# Patient Record
Sex: Female | Born: 1975 | Race: White | Hispanic: No | Marital: Married | Smoking: Former smoker
Health system: Southern US, Community
[De-identification: ages and names within clinical notes are randomized; demographics above are authoritative.]

## PROBLEM LIST (undated history)

## (undated) DIAGNOSIS — M1990 Unspecified osteoarthritis, unspecified site: Secondary | ICD-10-CM

## (undated) DIAGNOSIS — F419 Anxiety disorder, unspecified: Secondary | ICD-10-CM

## (undated) DIAGNOSIS — S22000S Wedge compression fracture of unspecified thoracic vertebra, sequela: Secondary | ICD-10-CM

## (undated) HISTORY — PX: UPPER GI ENDOSCOPY: SHX6162

## (undated) HISTORY — DX: Unspecified osteoarthritis, unspecified site: M19.90

## (undated) HISTORY — DX: Wedge compression fracture of unspecified thoracic vertebra, sequela: S22.000S

## (undated) HISTORY — PX: UPPER GASTROINTESTINAL ENDOSCOPY: SHX188

## (undated) HISTORY — DX: Anxiety disorder, unspecified: F41.9

---

## 2006-01-01 HISTORY — PX: UPPER GASTROINTESTINAL ENDOSCOPY: SHX188

## 2013-03-31 ENCOUNTER — Ambulatory Visit: Payer: Self-pay | Admitting: Family Medicine

## 2013-11-25 ENCOUNTER — Encounter: Payer: Self-pay | Admitting: Physical Medicine and Rehabilitation

## 2013-12-03 ENCOUNTER — Ambulatory Visit: Payer: Self-pay | Admitting: Family Medicine

## 2014-11-12 ENCOUNTER — Ambulatory Visit (INDEPENDENT_AMBULATORY_CARE_PROVIDER_SITE_OTHER): Payer: BC Managed Care – PPO

## 2014-11-12 DIAGNOSIS — Z23 Encounter for immunization: Secondary | ICD-10-CM

## 2014-11-24 ENCOUNTER — Telehealth: Payer: Self-pay | Admitting: Family Medicine

## 2014-11-24 NOTE — Telephone Encounter (Signed)
Returned call to discuss patient's symptoms and left a voice message

## 2014-11-24 NOTE — Telephone Encounter (Signed)
Patient has developed a bacteria infection and stated that dr Manuella Ghazi has prescribed her something for this in the past. She has appointment in December and wanted to know if he can call it in to [facility]

## 2014-11-24 NOTE — Telephone Encounter (Signed)
Routed to Dr. Manuella Ghazi for prescription approval

## 2014-12-20 ENCOUNTER — Ambulatory Visit (INDEPENDENT_AMBULATORY_CARE_PROVIDER_SITE_OTHER): Payer: BC Managed Care – PPO | Admitting: Family Medicine

## 2014-12-20 ENCOUNTER — Encounter: Payer: BC Managed Care – PPO | Admitting: Family Medicine

## 2014-12-20 ENCOUNTER — Encounter: Payer: Self-pay | Admitting: Family Medicine

## 2014-12-20 VITALS — BP 122/78 | HR 72 | Temp 98.6°F | Resp 16 | Ht 65.0 in | Wt 130.9 lb

## 2014-12-20 DIAGNOSIS — A499 Bacterial infection, unspecified: Secondary | ICD-10-CM | POA: Diagnosis not present

## 2014-12-20 DIAGNOSIS — N760 Acute vaginitis: Secondary | ICD-10-CM | POA: Diagnosis not present

## 2014-12-20 DIAGNOSIS — B9689 Other specified bacterial agents as the cause of diseases classified elsewhere: Secondary | ICD-10-CM

## 2014-12-20 LAB — POCT WET PREP (WET MOUNT)

## 2014-12-20 MED ORDER — CLINDAMYCIN PHOSPHATE 2 % VA CREA: 1.0000 | TOPICAL_CREAM | Freq: Every day | VAGINAL | Status: DC

## 2014-12-20 NOTE — Patient Instructions (Signed)
Bacterial Vaginosis Bacterial vaginosis is a vaginal infection that occurs when the normal balance of bacteria in the vagina is disrupted. It results from an overgrowth of certain bacteria. This is the most common vaginal infection in women of childbearing age. Treatment is important to prevent complications, especially in pregnant women, as it can cause a premature delivery. CAUSES  Bacterial vaginosis is caused by an increase in harmful bacteria that are normally present in smaller amounts in the vagina. Several different kinds of bacteria can cause bacterial vaginosis. However, the reason that the condition develops is not fully understood. RISK FACTORS Certain activities or behaviors can put you at an increased risk of developing bacterial vaginosis, including:  Having a new sex partner or multiple sex partners.  Douching.  Using an intrauterine device (IUD) for contraception. Women do not get bacterial vaginosis from toilet seats, bedding, swimming pools, or contact with objects around them. SIGNS AND SYMPTOMS  Some women with bacterial vaginosis have no signs or symptoms. Common symptoms include:  Grey vaginal discharge.  A fishlike odor with discharge, especially after sexual intercourse.  Itching or burning of the vagina and vulva.  Burning or pain with urination. DIAGNOSIS  Your health care provider will take a medical history and examine the vagina for signs of bacterial vaginosis. A sample of vaginal fluid may be taken. Your health care provider will look at this sample under a microscope to check for bacteria and abnormal cells. A vaginal pH test may also be done.  TREATMENT  Bacterial vaginosis may be treated with antibiotic medicines. These may be given in the form of a pill or a vaginal cream. A second round of antibiotics may be prescribed if the condition comes back after treatment. Because bacterial vaginosis increases your risk for sexually transmitted diseases, getting  treated can help reduce your risk for chlamydia, gonorrhea, HIV, and herpes. HOME CARE INSTRUCTIONS   Only take over-the-counter or prescription medicines as directed by your health care provider.  If antibiotic medicine was prescribed, take it as directed. Make sure you finish it even if you start to feel better.  Tell all sexual partners that you have a vaginal infection. They should see their health care provider and be treated if they have problems, such as a mild rash or itching.  During treatment, it is important that you follow these instructions:  Avoid sexual activity or use condoms correctly.  Do not douche.  Avoid alcohol as directed by your health care provider.  Avoid breastfeeding as directed by your health care provider. SEEK MEDICAL CARE IF:   Your symptoms are not improving after 3 days of treatment.  You have increased discharge or pain.  You have a fever. MAKE SURE YOU:   Understand these instructions.  Will watch your condition.  Will get help right away if you are not doing well or get worse. FOR MORE INFORMATION  Centers for Disease Control and Prevention, Division of STD Prevention: AppraiserFraud.fi American Sexual Health Association (ASHA): www.ashastd.org    This information is not intended to replace advice given to you by your health care provider. Make sure you discuss any questions you have with your health care provider.   Document Released: 12/18/2004 Document Revised: 01/08/2014 Document Reviewed: 07/30/2012 Elsevier Interactive Patient Education [employer].

## 2014-12-20 NOTE — Progress Notes (Signed)
Name: Kaitlyn Harvey   MRN: [id number]    DOB: [birth year]   Date:12/20/2014       Progress Note  Subjective  Chief Complaint  Chief Complaint  Patient presents with  . Vaginal Discharge    Onset-couple of weeks, off-white discharge, vaginal itching and discharge. Has tried Monostat with no relief.     HPI  Vaginal discharge: she has a history of BV's about once a year. Over the past two weeks she noted some vaginal discomfort. Initially some itching, she used otc Monistat with some improvement, but over the past week has noticed some vaginal odor, foul smelling. Discharge is off-white and intermittent. No pain with intercourse. No dysuria. No cramping. LMP 12/07/016  There are no active problems to display for this patient.   Past Surgical History  Procedure Laterality Date  . Upper gi endoscopy      Family History  Problem Relation Age of Onset  . Asthma Mother   . Hypertension Mother   . Thyroid disease Mother   . Hypertension Brother   . Bell's palsy Brother     Social History   Social History  . Marital Status: Single    Spouse Name: N/A  . Number of Children: N/A  . Years of Education: N/A   Occupational History  . Not on file.   Social History Main Topics  . Smoking status: Current Every Day Smoker -- 0.25 packs/day for 20 years    Types: Cigarettes    Start date: 12/20/1994  . Smokeless tobacco: Never Used  . Alcohol Use: 0.0 oz/week    0 Standard drinks or equivalent per week     Comment: occasionally  . Drug Use: No  . Sexual Activity:    Partners: Male   Other Topics Concern  . Not on file   Social History Narrative  . No narrative on file     Current outpatient prescriptions:  .  Biotin 10 MG CAPS, Take 1 capsule by mouth daily., Disp: , Rfl:  .  Multiple Vitamin (MULTIVITAMIN) tablet, Take 1 tablet by mouth daily., Disp: , Rfl:   No Known Allergies   ROS  Ten systems reviewed and is negative except as mentioned in HPI    Objective  Filed Vitals:   12/20/14 1416  BP: 122/78  Pulse: 72  Temp: 98.6 F (37 C)  TempSrc: Oral  Resp: 16  Height: 5' 5"  (1.651 m)  Weight: 130 lb 14.4 oz (59.376 kg)  SpO2: 99%    Body mass index is 21.78 kg/(m^2).  Physical Exam  Constitutional: Patient appears well-developed and well-nourished.  No distress.  HEENT: head atraumatic, normocephalic, pupils equal and reactive to light,  neck supple, throat within normal limits Cardiovascular: Normal rate, regular rhythm and normal heart sounds.  No murmur heard. No BLE edema. Pulmonary/Chest: Effort normal and breath sounds normal. No respiratory distress. GU: moderate vaginal discharge, normal bimanual exam Abdominal: Soft.  There is no tenderness. Psychiatric: Patient has a normal mood and affect. behavior is normal. Judgment and thought content normal.  PHQ2/9: Depression screen PHQ 2/9 12/20/2014  Decreased Interest 0  Down, Depressed, Hopeless 0  PHQ - 2 Score 0    Fall Risk: Fall Risk  12/20/2014  Falls in the past year? No    Functional Status Survey: Is the patient deaf or have difficulty hearing?: No Does the patient have difficulty seeing, even when wearing glasses/contacts?: Yes (glasses) Does the patient have difficulty concentrating, remembering, or making decisions?: No Does  the patient have difficulty walking or climbing stairs?: No Does the patient have difficulty dressing or bathing?: No Does the patient have difficulty doing errands alone such as visiting a doctor's office or shopping?: No    Assessment & Plan  1. Bacterial vaginosis  - POCT Wet Prep (Wet [place]) - clindamycin (CLEOCIN) 2 % vaginal cream; Place 1 Applicatorful vaginally at bedtime.  Dispense: 40 g; Refill: 1

## 2015-01-11 ENCOUNTER — Encounter: Payer: BC Managed Care – PPO | Admitting: Family Medicine

## 2015-02-03 ENCOUNTER — Encounter: Payer: BC Managed Care – PPO | Admitting: Family Medicine

## 2015-02-10 ENCOUNTER — Encounter: Payer: Self-pay | Admitting: Physical Therapy

## 2015-02-10 ENCOUNTER — Ambulatory Visit: Payer: BC Managed Care – PPO | Attending: Specialist | Admitting: Physical Therapy

## 2015-02-10 DIAGNOSIS — M545 Low back pain: Secondary | ICD-10-CM | POA: Insufficient documentation

## 2015-02-10 DIAGNOSIS — G8929 Other chronic pain: Secondary | ICD-10-CM | POA: Diagnosis present

## 2015-02-10 DIAGNOSIS — M6281 Muscle weakness (generalized): Secondary | ICD-10-CM | POA: Diagnosis present

## 2015-02-10 DIAGNOSIS — R2991 Unspecified symptoms and signs involving the musculoskeletal system: Secondary | ICD-10-CM | POA: Diagnosis present

## 2015-02-10 DIAGNOSIS — R29818 Other symptoms and signs involving the nervous system: Secondary | ICD-10-CM

## 2015-02-10 NOTE — Therapy (Signed)
[place] [place] [facility] [address], [place], [address] Phone: [phone]   Fax:  [phone]  Physical Therapy Evaluation  Patient Details  Name: Kaitlyn Harvey MRN: [id number] Date of Birth: [birth year] Referring Provider: Earnestine Leys MD  Encounter Date: 02/10/2015      PT End of Session - 02/10/15 1749    Visit Number 1   Number of Visits 12   Date for PT Re-Evaluation 03/24/15   PT Start Time 0430   PT Stop Time 0532   PT Time Calculation (min) 62 min   Activity Tolerance Patient tolerated treatment well;No increased pain   Behavior During Therapy [facility] for tasks assessed/performed      History reviewed. No pertinent past medical history.  Past Surgical History  Procedure Laterality Date  . Upper gi endoscopy      There were no vitals filed for this visit.  Visit Diagnosis:  Chronic midline low back pain without sciatica - Plan: PT plan of care cert/re-cert  Poor motor control of trunk - Plan: PT plan of care cert/re-cert  Muscle weakness - Plan: PT plan of care cert/re-cert      Subjective Assessment - 02/10/15 1655    Subjective Pain located in the center of the lumbar spine which spreads laterally when aggravated. Increased pain during bending, sitting, and lifting. Pain increased in the morning and at the end of the day . Sleeps on her side on a regular mattress .    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   Limitations Sitting;Lifting;Other (comment)  Bending   How long can you sit comfortably? 25 minutes   Patient Stated Goals learning proper exercises, decreasing pain to allow to perform work related activites.    Currently in Pain? Yes   Pain Score 5    Pain Location Back   Pain Descriptors / Indicators Aching;Sharp   Pain Type Chronic pain   Pain Radiating Towards No radiating  pain   Pain Onset More than a month ago   Aggravating Factors  Increased pain while performing bending, lifting, and during increased sitting time.   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  No   Is the patient reluctant to leave their home because of a fear of falling?  No   Home Environment   Living Environment Private residence   Living Arrangements Spouse/significant other;Children   Type of [place] to enter   Entrance Stairs-Number of Steps 2   Entrance Stairs-Rails None   Home Layout Two level   Alternate Level Stairs-Number of Steps 13   Alternate Level Stairs-Rails Right  Home Equipment None   Prior Function   Level of Independence Independent   Vocation Full time employment   Retail banker: Squatting, lifting, bending   Leisure Exercise(weight lifting), ride a bike, elicpital, treadmill,    Cognition   Overall Cognitive Status Within Functional Limits for tasks assessed     Objective:  Patient performed all therapeutic techniques under therapist guidance with cueing to correct and use proper form and allow for appropiate joint alignment. Observation: Decreased base of support when standing with minor hip internal rotation and adduction noted  Measurements: Lumbar AROM: Flexion: Hands to toes -- increased pain  noted at end range and returning to neutral position, extension: 50% of normal limits -- increased pain, left side bending: WNL, right side bending: WNL -- pain at end range, right rotation: WNL, Left rotation: WNL Hip AROM/MMT (all measurements are the same bilaterally) : Flexion: WNL; 5/5, Extension: WNL; 5/5, abduction: WNL; 5/5, adduction: WNL ;5/5, Hip external rotation: WNL; 5/5  Neuro Exam: Dermatomes: L1-5, S1-2: WNL Myotomes: L1-5, S1-2: 5/5 strong and painless Reflexes: patellar tendon, achilles: WNL  Passive assecories: Hypomobility: L1-5, S1-2 -- pain reported with grade 4 mobilization to L4-5  Special Tests: -SLR: (-) -Hamstring length: (-)  Motor Control Testing: -Multifidus test: Decreased on right side, hyperactivity on left side -Transverse Abdominis test: Weak activation required frequent cueing  Therapeutic Exercise: -Clamshells& Tra contractions -- Added to [facility] Therapy: -Passive assessorcies: L4-5: 45sec   Patient response to treatment: No aggravation of symptoms during or post treatment. Pt with decreased activation of left multifidus during prone hip extension indicating poor motor control. Decreased TrA activation during draw in maneuver and will benefit from further skilled therapy aimed at increasing motor control of post and anterior to better return to prior level of function.         PT Education - 02/10/15 1747    Education provided Yes   Education Details HEP: abdominal draw in, clamshells. Given handout of exercises and verbally explained pain theory and application to patients case.    Person(s) Educated Patient   Methods Explanation;Demonstration;Tactile cues;Handout   Comprehension Verbalized understanding             PT Long Term Goals - 02/10/15 1940    PT LONG TERM GOAL #1   Title Patient will score less than 10% on the Modified Oswestry to show functional improvement in lower back function by 03/24/15.   Baseline 32%  Impaired   Status New   PT LONG TERM GOAL #2   Title Patient will score less than 20/96 on the FABQ to demonstrate significant improvement in fear avoidance to allow for the completion of functional activities by 03/24/15.    Baseline 49/96   Status New   PT LONG TERM GOAL #3   Title Patient will be independent when performing HEP to show functional carryover after completion of therapy program by 03/24/15.   Baseline Patient with limited knowledge on proper exercise progression and pain control.    Status New               Plan - 02/10/15 1751    Clinical Impression Statement Patient is a 40 yo female who presents with central low back pain which inhibits  functional occupational activities: llifting, bending, and sitting for longer periods of tim. She demonstrates decreased motor control and core stabilization. She will benefit from skilled therapy aimed at improving motor control and core stabilization limitations in order to  return to prior level of function.     Pt will benefit from skilled therapeutic intervention in order to improve on the following deficits Decreased mobility;Decreased range of motion;Increased fascial restricitons;Hypomobility;Decreased strength;Increased muscle spasms;Pain   Rehab Potential Good   Clinical Impairments Affecting Rehab Potential (+) Motivation; (-) current smoker, chronic pain   PT Frequency 2x / week   PT Duration 6 weeks   PT Treatment/Interventions ADLs/Self Care Home Management;Cryotherapy;Electrical Stimulation;Iontophoresis 55m/ml Dexamethasone;Moist Heat;Patient/family education;Ultrasound;Neuromuscular re-education;Therapeutic exercise;Therapeutic activities;Manual techniques;Passive range of motion   PT Next Visit Plan Manual soft tissue mobilizations and passive assecories to lower lumbar spine. Address motor control defiects in lumbar spine.   PT Home Exercise Plan Abdominal draw in manuever, clamshells   Consulted and Agree with Plan  of Care Patient         Problem List There are no active problems to display for this patient.   WBlythe Stanford SPT 02/11/2015, 12:51 PM  [facility] [address] [place] [address]Phone: [phone]  Fax:  [phone] Name: ALakin RhineMRN: [id number] of Birth: [birth year]

## 2015-02-15 ENCOUNTER — Ambulatory Visit: Payer: BC Managed Care – PPO | Admitting: Physical Therapy

## 2015-02-15 ENCOUNTER — Encounter: Payer: BC Managed Care – PPO | Admitting: Physical Therapy

## 2015-02-15 DIAGNOSIS — G8929 Other chronic pain: Secondary | ICD-10-CM

## 2015-02-15 DIAGNOSIS — M545 Low back pain: Secondary | ICD-10-CM | POA: Diagnosis not present

## 2015-02-15 DIAGNOSIS — R2991 Unspecified symptoms and signs involving the musculoskeletal system: Secondary | ICD-10-CM

## 2015-02-15 DIAGNOSIS — M6281 Muscle weakness (generalized): Secondary | ICD-10-CM

## 2015-02-15 DIAGNOSIS — R29818 Other symptoms and signs involving the nervous system: Secondary | ICD-10-CM

## 2015-02-15 NOTE — Therapy (Signed)
[place] [facility] [address], [place], [address] Phone: [phone]   Fax:  [phone]  Physical Therapy Treatment  Patient Details  Name: Kaitlyn Harvey MRN: [id number] Date of Birth: 1975-08-10 Referring Provider: Earnestine Leys MD  Encounter Date: 02/15/2015      PT End of Session - 02/15/15 1728    Visit Number 2   Number of Visits 12   Date for PT Re-Evaluation 03/24/15   PT Start Time 4268   PT Stop Time 3419   PT Time Calculation (min) 44 min   Activity Tolerance Patient tolerated treatment well;No increased pain   Behavior During Therapy [facility] for tasks assessed/performed      No past medical history on file.  Past Surgical History  Procedure Laterality Date  . Upper gi endoscopy      There were no vitals filed for this visit.  Visit Diagnosis:  Chronic midline low back pain without sciatica  Poor motor control of trunk  Muscle weakness      Subjective Assessment - 02/15/15 1637    Subjective Pt reports increased throbbing pain yesterday after work. States menstral cycle is approaching and pain is increased.    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   Limitations Sitting;Lifting;Other (comment)   How long can you sit comfortably? 25 minutes   Patient Stated Goals learning proper exercises, decreasing pain to allow to perform work related activites.    Currently in Pain? No/denies      Objective:      [facility] Adult PT Treatment/Exercise - 02/15/15 1744    Exercises   Patient performed exercises with guidance, verbal and tactile cues and demonstration of therapist: Prone press ups -- 2 x 10 Standing Lumbar Ext -- x 10 TrA activation in hooklying with marching bilaterally -- x 10 (alternating legs) Clamshells bilaterally -- x 15 Prone hip ext bilaterally -- 2 x 10  (alternating legs; performed throughout partial range on right secondary to decreased motor control)    Manual Therapy   STM and superficial techniques to lumbar extensors in prone to decrease muscle spasms and increase elasticity of the tissue.        Patient response to treatment:  Decreased muscle spasms after performing STM indicating increased elasticity in the muscle fibers. Patient demonstrated difficulty when performing prone hip extension exercise requiring frequent tactile/verbal cueing and moving throughout partial ROM to correct. Patient demonstrated decreased pain along lower lumbar segments after performing prone press up indicating improved joint position and motor control.          PT Education - 02/15/15 1725    Education provided Yes   Education Details HEP: abdominal draw in, clamshells, prone hip ext, prone press up. Explained importance of motor control when performing exercises.    Person(s) Educated Patient   Methods Explanation;Demonstration;Tactile cues   Comprehension Verbalized understanding             PT Long Term Goals - 02/10/15 1940    PT LONG TERM GOAL #1   Title Patient will score less than 10% on the Modified Oswestry to show functional improvement in lower back function by 03/24/15.   Baseline 32% Impaired   Status New   PT LONG TERM GOAL #2   Title Patient will score less than 20/96 on the FABQ to demonstrate significant improvement in fear avoidance to  allow for the completion of functional activities by 03/24/15.    Baseline 49/96   Status New   PT LONG TERM GOAL #3   Title Patient will be independent when performing HEP to show functional carryover after completion of therapy program by 03/24/15.   Baseline Patient with limited knowledge on proper exercise progression and pain control.    Status New               Plan - 02/15/15 1733    Clinical Impression Statement Patient responded well to treatment and continues to advance  towards long term goals. Patient continued to exhibit decreased motor control with posterior/anterior core activation during the treatment session but had no exaccerbation in symptoms. Patient will benefit from further skilled therapy addressing motor conrol and endurance to better perform functional activities such as sitting for long periods of time and lifting at work.    Pt will benefit from skilled therapeutic intervention in order to improve on the following deficits Decreased mobility;Decreased range of motion;Increased fascial restricitons;Hypomobility;Decreased strength;Increased muscle spasms;Pain   Rehab Potential Good   Clinical Impairments Affecting Rehab Potential (+) Motivation; (-) current smoker, chronic pain   PT Frequency 2x / week   PT Duration 6 weeks   PT Treatment/Interventions ADLs/Self Care Home Management;Cryotherapy;Electrical Stimulation;Iontophoresis 51m/ml Dexamethasone;Moist Heat;Patient/family education;Ultrasound;Neuromuscular re-education;Therapeutic exercise;Therapeutic activities;Manual techniques;Passive range of motion   PT Next Visit Plan Add addition motor control exercises and continue STM with prone press up. Multifidus [place], [place].   PT Home Exercise Plan Abdominal draw in manuever, clamshells, prone hip ext.   Consulted and Agree with Plan of Care Patient        Problem List There are no active problems to display for this patient.   WBlythe Stanford SPT 02/15/2015, 6:02 PM  [facility] [address] [place] [address]Phone: [phone]  Fax:  [phone] Name: Kaitlyn SegersMRN: [id number] of Birth: 402-Nov-1977

## 2015-02-16 ENCOUNTER — Encounter: Payer: Self-pay | Admitting: Physical Therapy

## 2015-02-17 ENCOUNTER — Encounter: Payer: Self-pay | Admitting: Physical Therapy

## 2015-02-17 ENCOUNTER — Ambulatory Visit: Payer: BC Managed Care – PPO | Admitting: Physical Therapy

## 2015-02-17 DIAGNOSIS — M545 Low back pain, unspecified: Secondary | ICD-10-CM

## 2015-02-17 DIAGNOSIS — R2991 Unspecified symptoms and signs involving the musculoskeletal system: Secondary | ICD-10-CM

## 2015-02-17 DIAGNOSIS — M6281 Muscle weakness (generalized): Secondary | ICD-10-CM

## 2015-02-17 DIAGNOSIS — R29818 Other symptoms and signs involving the nervous system: Secondary | ICD-10-CM

## 2015-02-17 DIAGNOSIS — G8929 Other chronic pain: Secondary | ICD-10-CM

## 2015-02-17 NOTE — Therapy (Signed)
[facility] [address] [place], [address] Phone: [phone]   Fax:  [phone]  Physical Therapy Treatment  Patient Details  Name: Kaitlyn Harvey MRN: [id number] Date of Birth: [birth year] Referring Provider: Earnestine Leys MD  Encounter Date: 02/17/2015      PT End of Session - 02/17/15 1726    Visit Number 3   Number of Visits 12   Date for PT Re-Evaluation 03/24/15   PT Start Time 4081   PT Stop Time 4481   PT Time Calculation (min) 44 min   Activity Tolerance Patient tolerated treatment well;No increased pain   Behavior During Therapy [facility] for tasks assessed/performed      History reviewed. No pertinent past medical history.  Past Surgical History  Procedure Laterality Date  . Upper gi endoscopy      There were no vitals filed for this visit.  Visit Diagnosis:  Chronic midline low back pain without sciatica  Poor motor control of trunk  Muscle weakness      Subjective Assessment - 02/17/15 1639    Subjective Pt reports temporary increase in pain when bending over to attend to child at work but states she has no pain currently.   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   Limitations Sitting;Lifting;Other (comment)   How long can you sit comfortably? 25 minutes   Patient Stated Goals learning proper exercises, decreasing pain to allow to perform work related activites.    Currently in Pain? No/denies      Objective:  AROM: lumbar flexion: WNL's, extension limited by pain at end range pre treatment    [facility] Adult PT Treatment/Exercise - 02/17/15 1744    Exercises   Patient performed exercises with guidance, verbal and tactile cues and demonstration of therapist:   Prone press ups x 10  TrA activation in hooklying with marching bilaterally -- 2 x 5  Prone hip ext  bilaterally --x 15 (alternating legs; performed throughout partial range on right secondary to decreased motor control) Multifidus Crunch in sitting --  2 x 15 Lateral side stepping -- 10f with green band around thighs    Manual Therapy   STM and myofascial release utilizing superficial and deep techniques to lumbar extensors in prone to decrease muscle spasms and increase elasticity of ttissue.   Patient response to treatment:  Decreased muscle spasms by 50% lumbar extensors following manual therapy and prone press ups. Patient exhibited improvement in multifidus activation after performing prone hip extension indicating improvement in lumbar motor control and required frequent verbal cuing to perform with proper positioning and technique, Lumbar extension improved to WNL's following exercises in prone (press ups)          PT Education - 02/17/15 1722    Education provided Yes   Education Details HEP: Lateral side stepping with green band   Person(s) Educated Patient   Methods Explanation;Demonstration;Tactile cues   Comprehension Verbalized understanding             PT Long Term Goals - 02/10/15 1940    PT LONG TERM GOAL #1   Title Patient will score less than 10% on the Modified Oswestry to show functional improvement in lower back function by 03/24/15.   Baseline 32% Impaired   Status New   PT LONG TERM GOAL #2   Title Patient will score less than 20/96  on the FABQ to demonstrate significant improvement in fear avoidance to allow for the completion of functional activities by 03/24/15.    Baseline 49/96   Status New   PT LONG TERM GOAL #3   Title Patient will be independent when performing HEP to show functional carryover after completion of therapy program by 03/24/15.   Baseline Patient with limited knowledge on proper exercise progression and pain control.    Status New               Plan - 02/17/15 1728    Clinical Impression Statement Patient responded  well to treatment and demonstrated improved motor control with lumbar/pelvic motions. Patient is progressing towards long term goals with improvement of motor control in unweighted positions. She remains limited with motor control in standing positions and performing functional tasks. Pt will benefit from further skilled therapy aimed at increasing muscle coordination and endurance to the pelvic/lumbar musculature to better perform occupational duties.    Pt will benefit from skilled therapeutic intervention in order to improve on the following deficits Decreased mobility;Decreased range of motion;Increased fascial restricitons;Hypomobility;Decreased strength;Increased muscle spasms;Pain   Rehab Potential Good   Clinical Impairments Affecting Rehab Potential (+) Motivation; (-) current smoker, chronic pain   PT Frequency 2x / week   PT Duration 6 weeks   PT Treatment/Interventions ADLs/Self Care Home Management;Cryotherapy;Electrical Stimulation;Iontophoresis 32m/ml Dexamethasone;Moist Heat;Patient/family education;Ultrasound;Neuromuscular re-education;Therapeutic exercise;Therapeutic activities;Manual techniques;Passive range of motion   PT Next Visit Plan Add addition motor control exercises and continue STM with prone press up. .   PT Home Exercise Plan Abdominal draw in manuever, clamshells, prone hip ext.   Consulted and Agree with Plan of Care Patient        Problem List There are no active problems to display for this patient.   WBlythe Stanford SPT 02/17/2015, 5:42 PM  [facility] [address] [place] [address]Phone: [phone]  Fax:  [phone] Name: Kaitlyn RamdassMRN: [id number] of Birth: [birth year]

## 2015-02-23 ENCOUNTER — Ambulatory Visit: Payer: BC Managed Care – PPO | Admitting: Physical Therapy

## 2015-02-23 ENCOUNTER — Encounter: Payer: Self-pay | Admitting: Physical Therapy

## 2015-02-23 DIAGNOSIS — M545 Low back pain, unspecified: Secondary | ICD-10-CM

## 2015-02-23 DIAGNOSIS — G8929 Other chronic pain: Secondary | ICD-10-CM

## 2015-02-23 DIAGNOSIS — R2991 Unspecified symptoms and signs involving the musculoskeletal system: Secondary | ICD-10-CM

## 2015-02-23 DIAGNOSIS — M6281 Muscle weakness (generalized): Secondary | ICD-10-CM

## 2015-02-23 DIAGNOSIS — R29818 Other symptoms and signs involving the nervous system: Secondary | ICD-10-CM

## 2015-02-23 NOTE — Therapy (Signed)
[facility] [address], [place], [address] Phone: [phone]   Fax:  [phone]  Physical Therapy Treatment  Patient Details  Name: Kaitlyn Harvey MRN: [id number] Date of Birth: 09-13-1975 Referring Provider: Earnestine Leys MD  Encounter Date: 02/23/2015      PT End of Session - 02/23/15 1700    Visit Number 4   Number of Visits 12   Date for PT Re-Evaluation 03/24/15   PT Start Time 9163   PT Stop Time 1625   PT Time Calculation (min) 42 min   Activity Tolerance Patient tolerated treatment well   Behavior During Therapy [facility] for tasks assessed/performed      History reviewed. No pertinent past medical history.  Past Surgical History  Procedure Laterality Date  . Upper gi endoscopy      There were no vitals filed for this visit.  Visit Diagnosis:  Chronic midline low back pain without sciatica  Poor motor control of trunk  Muscle weakness      Subjective Assessment - 02/23/15 1544    Subjective Pt reports no pain currently and mentions having pain 4 hours ago when sitting. States having increased low back pain associated with her menstrual cycle yesterday.   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   Limitations Sitting;Lifting;Other (comment)   How long can you sit comfortably? 25 minutes   Patient Stated Goals learning proper exercises, decreasing pain to allow to perform work related activites.    Currently in Pain? No/denies       Objective:  AROM: lumbar flexion: WNL's, lumbar extension: limited by 25 with pain reported at end range.    [place] Adult PT Treatment/Exercise - 02/23/15    Exercises   Patient performed exercises with guidance, verbal and tactile cues and demonstration of therapist:  Standing lumbar extension --  2 x 10 Prone hip ext bilaterally --2  x 10 (alternating legs; performed throughout partial range on right secondary to decreased motor control) Multifidus Crunch in sitting -- x 15, x 10 with yellow band Lateral side stepping with backwards stepping -- x 7 each side with grey band  Lumbar extension with grey resistive band -- 2 x 10    Patient response to treatment:  Improved multifidus and gluteal activation with exercises with minimal verbal and tactile cuing, demonstrated good technique and alignment of trunk and extremities with repetition/verbal cues for all exercises       PT Long Term Goals - 02/10/15 1940    PT LONG TERM GOAL #1   Title Patient will score less than 10% on the Modified Oswestry to show functional improvement in lower back function by 03/24/15.   Baseline 32% Impaired   Status New   PT LONG TERM GOAL #2   Title Patient will score less than 20/96 on the FABQ to demonstrate significant improvement in fear avoidance to allow for the completion of functional activities by 03/24/15.    Baseline 49/96   Status New   PT LONG TERM GOAL #3   Title Patient will be independent when performing HEP to show functional carryover after completion of therapy program by 03/24/15.   Baseline Patient with limited knowledge on proper exercise progression and pain control.    Status New               Plan - 02/23/15 1702  Clinical Impression Statement Patient is progressing towards long term goals exhibiting increased motor control with anterior and posterior core activation.Required decreased tactile cueing today to perform TrA and demonstrated ability to perform TrA activation in standing indicating improved functional motor control.  Progressed exercises today tolerating increased weight bearing activities. She continues with limited knowledge of appropriate exercises and progression and will benefit from further skilled therapy aimed improving motor control and muscular endurance to return to prior level of  function.    Pt will benefit from skilled therapeutic intervention in order to improve on the following deficits Decreased mobility;Decreased range of motion;Increased fascial restricitons;Hypomobility;Decreased strength;Increased muscle spasms;Pain   Rehab Potential Good   Clinical Impairments Affecting Rehab Potential (+) Motivation; (-) current smoker, chronic pain   PT Frequency 2x / week   PT Duration 6 weeks   PT Treatment/Interventions ADLs/Self Care Home Management;Cryotherapy;Electrical Stimulation;Iontophoresis 28m/ml Dexamethasone;Moist Heat;Patient/family education;Ultrasound;Neuromuscular re-education;Therapeutic exercise;Therapeutic activities;Manual techniques;Passive range of motion   PT Next Visit Plan Further weight bearing core stabilzation activities.    PT Home Exercise Plan Abdominal draw in manuever, clamshells, prone hip ext, resisted side stepping, resisted lumbar ext   Consulted and Agree with Plan of Care Patient        Problem List There are no active problems to display for this patient.   WBlythe Stanford SPT 02/23/2015, 5:12 PM  [facility] [address] [place] [address]Phone: [phone]  Fax:  [phone] Name: Kaitlyn McgroryMRN: [id number] of Birth: 404/13/1977

## 2015-02-28 ENCOUNTER — Ambulatory Visit (INDEPENDENT_AMBULATORY_CARE_PROVIDER_SITE_OTHER): Payer: BC Managed Care – PPO | Admitting: Family Medicine

## 2015-02-28 ENCOUNTER — Encounter: Payer: Self-pay | Admitting: Family Medicine

## 2015-02-28 ENCOUNTER — Ambulatory Visit: Payer: BC Managed Care – PPO | Admitting: Physical Therapy

## 2015-02-28 ENCOUNTER — Other Ambulatory Visit: Payer: Self-pay | Admitting: Family Medicine

## 2015-02-28 VITALS — BP 120/79 | HR 77 | Temp 98.2°F | Resp 16 | Ht 65.0 in | Wt 124.7 lb

## 2015-02-28 DIAGNOSIS — Z0000 Encounter for general adult medical examination without abnormal findings: Secondary | ICD-10-CM | POA: Insufficient documentation

## 2015-02-28 DIAGNOSIS — Z1239 Encounter for other screening for malignant neoplasm of breast: Secondary | ICD-10-CM | POA: Insufficient documentation

## 2015-02-28 DIAGNOSIS — Z01419 Encounter for gynecological examination (general) (routine) without abnormal findings: Secondary | ICD-10-CM | POA: Diagnosis not present

## 2015-02-28 DIAGNOSIS — Z720 Tobacco use: Secondary | ICD-10-CM

## 2015-02-28 MED ORDER — NICOTINE 7 MG/24HR TD PT24: 7.0000 mg | MEDICATED_PATCH | Freq: Every day | TRANSDERMAL | Status: DC

## 2015-02-28 MED ORDER — NICOTINE 14 MG/24HR TD PT24: 14.0000 mg | MEDICATED_PATCH | Freq: Every day | TRANSDERMAL | Status: DC

## 2015-02-28 NOTE — Progress Notes (Signed)
Name: Janaiyah Blackard   MRN: [id number]    DOB: Jan 09, 1975   Date:02/28/2015       Progress Note  Subjective  Chief Complaint  Chief Complaint  Patient presents with  . Annual Exam    CPE w pap    Nicotine Dependence Presents for initial visit. Symptoms are negative for insomnia and sore throat. Preferred tobacco types include cigarettes. Her urge triggers include company of smokers. Past treatments include nicotine patch (Was on Nictoine 46m/hr patch, which was too strong.).    Pt. Is here for a Complete Physical Exam. She is doing well.   History reviewed. No pertinent past medical history.  Past Surgical History  Procedure Laterality Date  . Upper gi endoscopy      Family History  Problem Relation Age of Onset  . Asthma Mother   . Hypertension Mother   . Thyroid disease Mother   . Hypertension Brother   . Bell's palsy Brother     Social History   Social History  . Marital Status: Single    Spouse Name: N/A  . Number of Children: N/A  . Years of Education: N/A   Occupational History  . Not on file.   Social History Main Topics  . Smoking status: Current Every Day Smoker -- 0.25 packs/day for 20 years    Types: Cigarettes    Start date: 12/20/1994  . Smokeless tobacco: Never Used  . Alcohol Use: 0.0 oz/week    0 Standard drinks or equivalent per week     Comment: occasionally  . Drug Use: No  . Sexual Activity:    Partners: Male   Other Topics Concern  . Not on file   Social History Narrative    Current outpatient prescriptions:  .  Biotin 10 MG CAPS, Take 1 capsule by mouth daily., Disp: , Rfl:  .  meloxicam (MOBIC) 15 MG tablet, Take 15 mg by mouth daily., Disp: , Rfl:  .  methocarbamol (ROBAXIN) 500 MG tablet, Take 500 mg by mouth 2 (two) times daily., Disp: , Rfl: 3 .  Multiple Vitamin (MULTIVITAMIN) tablet, Take 1 tablet by mouth daily., Disp: , Rfl:   No Known Allergies   Review of Systems  Constitutional: Negative for fever, chills,  weight loss and malaise/fatigue.  HENT: Negative for sore throat.   Eyes: Negative for blurred vision and double vision.  Respiratory: Negative for cough and sputum production.   Cardiovascular: Negative for chest pain.  Gastrointestinal: Negative for heartburn, nausea, vomiting, abdominal pain, blood in stool and melena.  Genitourinary: Negative for dysuria and hematuria.  Musculoskeletal: Positive for back pain. Negative for myalgias.  Skin: Negative for rash.  Neurological: Negative for headaches.  Psychiatric/Behavioral: Negative for depression. The patient is not nervous/anxious and does not have insomnia.     Objective  Filed Vitals:   02/28/15 1427  BP: 120/79  Pulse: 77  Temp: 98.2 F (36.8 C)  TempSrc: Oral  Resp: 16  Height: 5' 5"  (1.651 m)  Weight: 124 lb 11.2 oz (56.564 kg)  SpO2: 98%    Physical Exam  Constitutional: She is oriented to person, place, and time and well-developed, well-nourished, and in no distress.  HENT:  Head: Normocephalic and atraumatic.  Eyes: Pupils are equal, round, and reactive to light. Right conjunctiva has a hemorrhage.    Cardiovascular: Normal rate and regular rhythm.   Pulmonary/Chest: Effort normal and breath sounds normal.  Abdominal: Soft. Bowel sounds are normal.  Musculoskeletal:  Right ankle: She exhibits no swelling.       Left ankle: She exhibits no swelling.  Neurological: She is alert and oriented to person, place, and time.  Psychiatric: Memory, affect and judgment normal.  Nursing note and vitals reviewed.    Assessment & Plan  1. Well woman exam with routine gynecological exam Age appropriate laboratory screenings including Pap smear. - Lipid Profile - CBC with Differential - Comprehensive Metabolic Panel (CMET) - TSH - Vitamin D (25 hydroxy) - Cytology - PAP  2. Screening for breast cancer  - MM Digital Screening; Future  3. Tobacco abuse Started on nicotine patch starting with 40 mg for 6  weeks, transitioning to 7 mg for 2 weeks. Quit smoking at onset of treatment. Recheck in 8 weeks - nicotine (NICODERM CQ - DOSED IN MG/24 HOURS) 14 mg/24hr patch; Place 1 patch (14 mg total) onto the skin daily.  Dispense: 42 patch; Refill: 0 - nicotine (NICODERM CQ - DOSED IN MG/24 HR) 7 mg/24hr patch; Place 1 patch (7 mg total) onto the skin daily.  Dispense: 14 patch; Refill: 0   Kaloni Bisaillon Asad A[facility] 02/28/2015 2:43 PM

## 2015-03-01 NOTE — Addendum Note (Signed)
Addended byManuella Ghazi, Annalysia Willenbring A A on: 03/01/2015 09:06 AM   Modules accepted: SmartSet

## 2015-03-02 ENCOUNTER — Encounter: Payer: Self-pay | Admitting: Physical Therapy

## 2015-03-02 ENCOUNTER — Ambulatory Visit: Payer: BC Managed Care – PPO | Attending: Specialist | Admitting: Physical Therapy

## 2015-03-02 DIAGNOSIS — M545 Low back pain, unspecified: Secondary | ICD-10-CM

## 2015-03-02 DIAGNOSIS — G8929 Other chronic pain: Secondary | ICD-10-CM | POA: Diagnosis present

## 2015-03-02 DIAGNOSIS — M6281 Muscle weakness (generalized): Secondary | ICD-10-CM | POA: Diagnosis present

## 2015-03-02 DIAGNOSIS — R2991 Unspecified symptoms and signs involving the musculoskeletal system: Secondary | ICD-10-CM | POA: Diagnosis not present

## 2015-03-02 DIAGNOSIS — R29818 Other symptoms and signs involving the nervous system: Secondary | ICD-10-CM

## 2015-03-02 NOTE — Therapy (Signed)
[facility] [address], [place], [address] Phone: [phone]   Fax:  [phone]  Physical Therapy Treatment  Patient Details  Name: Kaitlyn Harvey MRN: [id number] Date of Birth: [birth year] Referring Provider: Earnestine Leys MD  Encounter Date: 03/02/2015      PT End of Session - 03/02/15 1837    Visit Number 5   Number of Visits 12   Date for PT Re-Evaluation 03/24/15   PT Start Time 1620   PT Stop Time 1659   PT Time Calculation (min) 39 min   Activity Tolerance Patient tolerated treatment well   Behavior During Therapy [facility] for tasks assessed/performed      History reviewed. No pertinent past medical history.  Past Surgical History  Procedure Laterality Date  . Upper gi endoscopy      There were no vitals filed for this visit.  Visit Diagnosis:  Poor motor control of trunk  Chronic midline low back pain without sciatica  Muscle weakness      Subjective Assessment - 03/02/15 1626    Subjective Pt reports no current low back pain. States she was able to perform occupational tasks without onset of pain today.   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   Limitations Sitting;Lifting;Other (comment)   How long can you sit comfortably? 25 minutes   Patient Stated Goals learning proper exercises, decreasing pain to allow to perform work related activites.    Currently in Pain? No/denies   Pain Score 0-No pain       Objective:  AROM: lumbar flexion: WNL's, lumbar extension: WNL -- no pain reported at end range.   [place] Adult PT Treatment/Exercise - 03/02/15    Exercises   Patient performed exercises with guidance, verbal and tactile cues and demonstration of therapist:  Multifidus Crunch in sitting -- 2 x 10 with red band Dead bud alternating legs -- x 10 Birddog  alternating arms and legs-- x10 alternating LEs Hip machine: ABD -- 40 # ADD & EXT -- 70# x15 each LE Lumbar extension with grey resistive band -- 2 x 10    Patient response to treatment:  Patient required minimal tactile and verbal cueing to perform multifidus crunch with appropriate muscular activation. Increased pelvic rotation when performing birddog exercise which is corrected after cueing to activate anterior core musculature, improved technique with exercises for hip exercises in standing following demonstration and with verbal cuing      PT Education - 03/02/15 1837    Education provided Yes   Education Details HEP: birddog, deadbug exercise   Person(s) Educated Patient   Methods Explanation;Demonstration   Comprehension Verbalized understanding             PT Long Term Goals - 02/10/15 1940    PT LONG TERM GOAL #1   Title Patient will score less than 10% on the Modified Oswestry to show functional improvement in lower back function by 03/24/15.   Baseline 32% Impaired   Status New   PT LONG TERM GOAL #2   Title Patient will score less than 20/96 on the FABQ to demonstrate significant improvement in fear avoidance to allow for the completion of functional activities by 03/24/15.    Baseline 49/96   Status New   PT LONG TERM GOAL #3   Title Patient will be independent when performing HEP to show functional  carryover after completion of therapy program by 03/24/15.   Baseline Patient with limited knowledge on proper exercise progression and pain control.    Status New               Plan - 03/02/15 1840    Clinical Impression Statement Patient is progressing towards long term goals with improving core stabilization and exercise progression. Patient continues with limitations of decreased muscular endurance and motor control and will benefit from continued skilled therapy aimed at improving limitations to return to prior level of function.    Pt will benefit from  skilled therapeutic intervention in order to improve on the following deficits Decreased mobility;Decreased range of motion;Increased fascial restricitons;Hypomobility;Decreased strength;Increased muscle spasms;Pain   Rehab Potential Good   Clinical Impairments Affecting Rehab Potential (+) Motivation; (-) current smoker, chronic pain   PT Frequency 2x / week   PT Duration 6 weeks   PT Treatment/Interventions ADLs/Self Care Home Management;Cryotherapy;Electrical Stimulation;Iontophoresis 23m/ml Dexamethasone;Moist Heat;Patient/family education;Ultrasound;Neuromuscular re-education;Therapeutic exercise;Therapeutic activities;Manual techniques;Passive range of motion   PT Next Visit Plan Further weight bearing core stabilzation activities.    PT Home Exercise Plan Abdominal draw in manuever, clamshells, prone hip ext, resisted side stepping, resisted lumbar ext   Consulted and Agree with Plan of Care Patient        Problem List Patient Active Problem List   Diagnosis Date Noted  . Well woman exam with routine gynecological exam 02/28/2015  . Tobacco abuse 02/28/2015  . Screening for breast cancer 02/28/2015    WBlythe Stanford SPT 03/02/2015, 6:52 PM  [facility] [address] [place] [address]Phone: [phone]  Fax:  [phone] Name: Kaitlyn SarrisMRN: [id number] of Birth: 418-Sep-1977

## 2015-03-04 LAB — PAP LB, RFX HPV ASCU: PAP Smear Comment: 0

## 2015-03-07 ENCOUNTER — Ambulatory Visit: Payer: BC Managed Care – PPO | Admitting: Physical Therapy

## 2015-03-09 ENCOUNTER — Ambulatory Visit: Payer: BC Managed Care – PPO | Admitting: Physical Therapy

## 2015-03-09 LAB — CBC WITH DIFFERENTIAL/PLATELET
BASOS ABS: 0.1 10*3/uL (ref 0.0–0.2)
BASOS: 1 %
EOS (ABSOLUTE): 0.4 10*3/uL (ref 0.0–0.4)
EOS: 6 %
Hematocrit: 39.2 % (ref 34.0–46.6)
Hemoglobin: 13.3 g/dL (ref 11.1–15.9)
IMMATURE GRANS (ABS): 0 10*3/uL (ref 0.0–0.1)
Immature Granulocytes: 0 %
Lymphocytes Absolute: 3 10*3/uL (ref 0.7–3.1)
Lymphs: 52 %
MCH: 33.8 pg — ABNORMAL HIGH (ref 26.6–33.0)
MCHC: 33.9 g/dL (ref 31.5–35.7)
MCV: 100 fL — AB (ref 79–97)
Monocytes Absolute: 0.5 10*3/uL (ref 0.1–0.9)
Monocytes: 9 %
Neutrophils Absolute: 1.9 10*3/uL (ref 1.4–7.0)
Neutrophils: 32 %
PLATELETS: 306 10*3/uL (ref 150–379)
RBC: 3.94 x10E6/uL (ref 3.77–5.28)
RDW: 13.8 % (ref 12.3–15.4)
WBC: 5.9 10*3/uL (ref 3.4–10.8)

## 2015-03-09 LAB — COMPREHENSIVE METABOLIC PANEL
ALT: 15 IU/L (ref 0–32)
AST: 23 IU/L (ref 0–40)
Albumin/Globulin Ratio: 2 (ref 1.1–2.5)
Albumin: 4.7 g/dL (ref 3.5–5.5)
Alkaline Phosphatase: 44 IU/L (ref 39–117)
BUN/Creatinine Ratio: 15 (ref 9–23)
BUN: 11 mg/dL (ref 6–24)
Bilirubin Total: 0.3 mg/dL (ref 0.0–1.2)
CALCIUM: 9.3 mg/dL (ref 8.7–10.2)
CO2: 22 mmol/L (ref 18–29)
CREATININE: 0.72 mg/dL (ref 0.57–1.00)
Chloride: 101 mmol/L (ref 96–106)
GFR, EST AFRICAN AMERICAN: 120 mL/min/{1.73_m2} (ref >59)
GFR, EST NON AFRICAN AMERICAN: 104 mL/min/{1.73_m2} (ref >59)
GLUCOSE: 80 mg/dL (ref 65–99)
Globulin, Total: 2.3 g/dL (ref 1.5–4.5)
Potassium: 4.4 mmol/L (ref 3.5–5.2)
Sodium: 141 mmol/L (ref 134–144)
TOTAL PROTEIN: 7 g/dL (ref 6.0–8.5)

## 2015-03-09 LAB — TSH: TSH: 2.37 u[IU]/mL (ref 0.450–4.500)

## 2015-03-09 LAB — LIPID PANEL
CHOLESTEROL TOTAL: 169 mg/dL (ref 100–199)
Chol/HDL Ratio: 1.5 ratio units (ref 0.0–4.4)
HDL: 112 mg/dL (ref >39)
LDL CALC: 42 mg/dL (ref 0–99)
TRIGLYCERIDES: 77 mg/dL (ref 0–149)
VLDL CHOLESTEROL CAL: 15 mg/dL (ref 5–40)

## 2015-03-09 LAB — VITAMIN D 25 HYDROXY (VIT D DEFICIENCY, FRACTURES): Vit D, 25-Hydroxy: 30.5 ng/mL (ref 30.0–100.0)

## 2015-03-14 ENCOUNTER — Encounter: Payer: BC Managed Care – PPO | Admitting: Physical Therapy

## 2015-03-16 ENCOUNTER — Ambulatory Visit: Payer: BC Managed Care – PPO | Admitting: Physical Therapy

## 2015-03-22 ENCOUNTER — Encounter: Payer: BC Managed Care – PPO | Admitting: Physical Therapy

## 2015-04-25 ENCOUNTER — Ambulatory Visit: Payer: BC Managed Care – PPO | Admitting: Family Medicine

## 2015-08-09 ENCOUNTER — Ambulatory Visit: Payer: BC Managed Care – PPO | Attending: Pain Medicine | Admitting: Pain Medicine

## 2015-08-09 ENCOUNTER — Encounter: Payer: Self-pay | Admitting: Pain Medicine

## 2015-08-09 VITALS — BP 104/69 | HR 53 | Temp 98.2°F | Resp 16 | Ht 65.0 in | Wt 130.0 lb

## 2015-08-09 DIAGNOSIS — Q765 Cervical rib: Secondary | ICD-10-CM | POA: Insufficient documentation

## 2015-08-09 DIAGNOSIS — M545 Low back pain: Secondary | ICD-10-CM | POA: Diagnosis present

## 2015-08-09 DIAGNOSIS — M5136 Other intervertebral disc degeneration, lumbar region: Secondary | ICD-10-CM | POA: Diagnosis not present

## 2015-08-09 DIAGNOSIS — M5134 Other intervertebral disc degeneration, thoracic region: Secondary | ICD-10-CM | POA: Insufficient documentation

## 2015-08-09 DIAGNOSIS — M4854XA Collapsed vertebra, not elsewhere classified, thoracic region, initial encounter for fracture: Secondary | ICD-10-CM | POA: Diagnosis not present

## 2015-08-09 DIAGNOSIS — M8448XA Pathological fracture, other site, initial encounter for fracture: Secondary | ICD-10-CM | POA: Diagnosis not present

## 2015-08-09 DIAGNOSIS — X58XXXA Exposure to other specified factors, initial encounter: Secondary | ICD-10-CM | POA: Insufficient documentation

## 2015-08-09 DIAGNOSIS — M533 Sacrococcygeal disorders, not elsewhere classified: Secondary | ICD-10-CM | POA: Diagnosis not present

## 2015-08-09 DIAGNOSIS — M47816 Spondylosis without myelopathy or radiculopathy, lumbar region: Secondary | ICD-10-CM | POA: Insufficient documentation

## 2015-08-09 DIAGNOSIS — S22009A Unspecified fracture of unspecified thoracic vertebra, initial encounter for closed fracture: Secondary | ICD-10-CM

## 2015-08-09 DIAGNOSIS — M4184 Other forms of scoliosis, thoracic region: Secondary | ICD-10-CM | POA: Insufficient documentation

## 2015-08-09 DIAGNOSIS — G588 Other specified mononeuropathies: Secondary | ICD-10-CM | POA: Insufficient documentation

## 2015-08-09 DIAGNOSIS — M51369 Other intervertebral disc degeneration, lumbar region without mention of lumbar back pain or lower extremity pain: Secondary | ICD-10-CM | POA: Insufficient documentation

## 2015-08-09 NOTE — Patient Instructions (Addendum)
PLAN  Continue present medications  Block of nerves to the sacroiliac joint to be performed at time of return appointment  F/U PCP  Dr Keith Rake for evaluation of  BP and general medical  condition  F/U surgical evaluation. May consider pending follow-up evaluations  F/U neurological evaluation. May consider pending follow-up evaluations  Please ask the nurses and secretary the date of your lumbar MRI  May consider radiofrequency rhizolysis or intraspinal procedures pending response to present treatment and F/U evaluation   Patient to call [facility] should patient have concerns prior to scheduled return appointment.   Sacroiliac (SI) Joint Injection Patient Information  Description: The sacroiliac joint connects the scrum (very low back and tailbone) to the ilium (a pelvic bone which also forms half of the hip joint).  Normally this joint experiences very little motion.  When this joint becomes inflamed or unstable low back and or hip and pelvis pain may result.  Injection of this joint with local anesthetics (numbing medicines) and steroids can provide diagnostic information and reduce pain.  This injection is performed with the aid of x-ray guidance into the tailbone area while you are lying on your stomach.   You may experience an electrical sensation down the leg while this is being done.  You may also experience numbness.  We also may ask if we are reproducing your normal pain during the injection.  Conditions which may be treated SI injection:   Low back, buttock, hip or leg pain  Preparation for the Injection:  1. Do not eat any solid food or dairy products within 8 hours of your appointment.  2. You may drink clear liquids up to 3 hours before appointment.  Clear liquids include water, black coffee, juice or soda.  No milk or cream please. 3. You may take your regular medications, including pain medications with a sip of water before your appointment.  Diabetics  should hold regular insulin (if take separately) and take 1/2 normal NPH dose the morning of the procedure.  Carry some sugar containing items with you to your appointment. 4. A driver must accompany you and be prepared to drive you home after your procedure. 5. Bring all of your current medications with you. 6. An IV may be inserted and sedation may be given at the discretion of the physician. 7. A blood pressure cuff, EKG and other monitors will often be applied during the procedure.  Some patients may need to have extra oxygen administered for a short period.  8. You will be asked to provide medical information, including your allergies, prior to the procedure.  We must know immediately if you are taking blood thinners (like Coumadin/Warfarin) or if you are allergic to IV iodine contrast (dye).  We must know if you could possible be pregnant.  Possible side effects:   Bleeding from needle site  Infection (rare, may require surgery)  Nerve injury (rare)  Numbness & tingling (temporary)  A brief convulsion or seizure  Light-headedness (temporary)  Pain at injection site (several days)  Decreased blood pressure (temporary)  Weakness in the leg (temporary)   Call if you experience:   New onset weakness or numbness of an extremity below the injection site that last more than 8 hours.  Hives or difficulty breathing ( go to the emergency room)  Inflammation or drainage at the injection site  Any new symptoms which are concerning to you  Please note:  Although the local anesthetic injected can often make your  back/ hip/ buttock/ leg feel good for several hours after the injections, the pain will likely return.  It takes 3-7 days for steroids to work in the sacroiliac area.  You may not notice any pain relief for at least that one week.  If effective, we will often do a series of three injections spaced 3-6 weeks apart to maximally decrease your pain.  After the initial series,  we generally will wait some months before a repeat injection of the same type.  If you have any questions, please call [phone] [facility]

## 2015-08-09 NOTE — Progress Notes (Signed)
The patient is a 40 year old female who comes to pain management Center at the request of Dr. Earnestine Leys for further evaluation and treatment of pain involving the lower back region. The patient states her pain began approximately 15 years ago insidiously. The patient is undergone prior evaluation and treatment at pain clinic in [place] and underwent interventional treatment performed by  Dr. Brien Few.. The patient stated that she left the clinic because she was not pleased with the remainder of the staff who made her cry. The patient denies any recent trauma change in events of daily living the call significant change in symptomatology. On today's visit the patient stated that her lower back pain was aggravated by bending and lifting sitting working and decreased with standing warm showers baths stretching. The patient described her pain as aching shooting pulsating sensation. We informed the patient that we would like to update studies of the spine after we review radiographic studies of the spine which revealed patient to be with evidence of thoracic compression fractures at T8 and T9 as well as disc space narrowing at L4-5 in the lumbar region and distal other abnormalities noted. After evaluation of patient's condition the patient was felt to be component of pain due to sacroiliac joint dysfunction. We discussed patient's condition and we will proceed with block of nerves to the sacroiliac joint to be performed at time of return appointment in attempt to decrease severity of symptoms, minimize progression of symptoms, and avoid the need for more involved treatment. All agreed to suggested treatment plan     Cardiovascular: Unremarkable    Pulmonary: Unremarkable  Neurological: Unremarkable  Psychological: Unremarkable  Gastrointestinal: Unremarkable  Genitourinary: Unremarkable  Hematologic: Unremarkable  Endocrine: Unremarkable  Rheumatological: Unremarkable  Musculoskeletal: Unremarkable  Other significant: Unremarkable     Physical examination  There was minimal tenderness of the splenius capitis and occipitalis region as well as the cervical and thoracic facet region. Palpation of the acromioclavicular and glenohumeral joint regions reproduces minimal discomfort as well. There was unremarkable Spurling's maneuver and Tinel and Phalen's maneuver without increase of pain of significant degree. The patient was with bilaterally equal grip strength. Palpation of the thoracic region was with no crepitus of the thoracic region noted. There was tenderness to palpation over the lumbar paraspinal musculatures and lumbar facet region with lateral bending rotation extension and palpation over the lumbar facets reproducing moderate discomfort. There was moderate to moderately severe tenderness of the PSIS and PII S region with mild tenderness of the greater trochanteric region iliotibial band region. Straight leg raise was tolerates appointment 30 without increase of pain with dorsiflexion noted. There was negative clonus negative Homans. DTRs appeared to be trace at the knees. Knees were with negative anterior and posterior drawer signs without ballottement of the patella. No increased warmth erythema of the knees noted. There was increase of pain with pressure applied to the ileum with patient in lateral cues position there was negative clonus negative Homans. Abdomen was nontender with no costovertebral tenderness noted     Assessment   Degenerative disc disease lumbar region Rudimentary ribs at T12. Chronic appearing fractures T9 and T10. See  separate thoracic spine report.   L5 is incorporated the  sacrum with transitional lumbosacral anatomy.  There is disc degeneration with disc space narrowing at L4-5.  Remaining disc spaces are normal. Negative for fracture or pars  defect.  Disc degeneration L4-5. L5 is a transitional segment, incorporated  into the sacrum. No acute lumbar  spine abnormality.    Degenerative disc disease thoracic region Mild fractures at approximately T9 and T10. These are most likely  chronic. No acute fracture or mass. Disc spaces are maintained.  Minimal dextroscoliosis at T8-9.  Small cervical rib on the left at C7.  Rudimentary ribs at T12. Mild chronic appearing fractures T9 and T10.  No acute abnormality.   Sacroiliac joint dysfunction  Lumbar facet syndrome  Intercostal neuralgia  Thoracic compression fractures     PLAN  Continue present medications  Block of nerves to the sacroiliac joint to be performed at time of return appointment  F/U PCP  Dr Keith Rake for evaluation of  BP and general medical  condition  F/U surgical evaluation. May consider pending follow-up evaluations  F/U neurological evaluation. May consider pending follow-up evaluations  Please ask the nurses and secretary the date of your lumbar MRI  May consider radiofrequency rhizolysis or intraspinal procedures pending response to present treatment and F/U evaluation   Patient to call [facility] should patient have concerns prior to scheduled return appointment.

## 2015-08-17 LAB — TOXASSURE SELECT 13 (MW), URINE: PDF: 0

## 2015-08-17 NOTE — Progress Notes (Signed)
Reviewed

## 2015-08-31 ENCOUNTER — Ambulatory Visit: Payer: BC Managed Care – PPO

## 2015-09-07 ENCOUNTER — Encounter: Payer: Self-pay | Admitting: Pain Medicine

## 2015-09-07 ENCOUNTER — Ambulatory Visit: Payer: BC Managed Care – PPO | Attending: Pain Medicine | Admitting: Pain Medicine

## 2015-09-07 VITALS — BP 118/67 | HR 55 | Temp 96.7°F | Resp 14 | Ht 65.0 in | Wt 130.0 lb

## 2015-09-07 DIAGNOSIS — M419 Scoliosis, unspecified: Secondary | ICD-10-CM | POA: Insufficient documentation

## 2015-09-07 DIAGNOSIS — X58XXXA Exposure to other specified factors, initial encounter: Secondary | ICD-10-CM | POA: Diagnosis not present

## 2015-09-07 DIAGNOSIS — M533 Sacrococcygeal disorders, not elsewhere classified: Secondary | ICD-10-CM | POA: Insufficient documentation

## 2015-09-07 DIAGNOSIS — M8448XA Pathological fracture, other site, initial encounter for fracture: Secondary | ICD-10-CM | POA: Diagnosis not present

## 2015-09-07 DIAGNOSIS — M5134 Other intervertebral disc degeneration, thoracic region: Secondary | ICD-10-CM | POA: Insufficient documentation

## 2015-09-07 DIAGNOSIS — M791 Myalgia: Secondary | ICD-10-CM | POA: Diagnosis not present

## 2015-09-07 DIAGNOSIS — M5136 Other intervertebral disc degeneration, lumbar region: Secondary | ICD-10-CM | POA: Diagnosis not present

## 2015-09-07 DIAGNOSIS — S2232XA Fracture of one rib, left side, initial encounter for closed fracture: Secondary | ICD-10-CM | POA: Insufficient documentation

## 2015-09-07 DIAGNOSIS — M545 Low back pain: Secondary | ICD-10-CM | POA: Diagnosis present

## 2015-09-07 DIAGNOSIS — S22009A Unspecified fracture of unspecified thoracic vertebra, initial encounter for closed fracture: Secondary | ICD-10-CM

## 2015-09-07 DIAGNOSIS — M47816 Spondylosis without myelopathy or radiculopathy, lumbar region: Secondary | ICD-10-CM

## 2015-09-07 DIAGNOSIS — M79606 Pain in leg, unspecified: Secondary | ICD-10-CM | POA: Diagnosis present

## 2015-09-07 MED ORDER — MIDAZOLAM HCL 5 MG/5ML IJ SOLN
INTRAMUSCULAR | Status: AC
  Administered 2015-09-07: 5 mg via INTRAVENOUS
  Filled 2015-09-07: qty 5

## 2015-09-07 MED ORDER — ORPHENADRINE CITRATE 30 MG/ML IJ SOLN
INTRAMUSCULAR | Status: AC
  Filled 2015-09-07: qty 2

## 2015-09-07 MED ORDER — TRIAMCINOLONE ACETONIDE 40 MG/ML IJ SUSP: 40.0000 mg | Freq: Once | INTRAMUSCULAR | Status: DC

## 2015-09-07 MED ORDER — LACTATED RINGERS IV SOLN: 1000.0000 mL | INTRAVENOUS | Status: DC

## 2015-09-07 MED ORDER — FENTANYL CITRATE (PF) 100 MCG/2ML IJ SOLN
INTRAMUSCULAR | Status: AC
  Administered 2015-09-07: 100 ug via INTRAVENOUS
  Filled 2015-09-07: qty 2

## 2015-09-07 MED ORDER — TRIAMCINOLONE ACETONIDE 40 MG/ML IJ SUSP
INTRAMUSCULAR | Status: AC
  Administered 2015-09-07: 40 mg
  Filled 2015-09-07: qty 1

## 2015-09-07 MED ORDER — BUPIVACAINE HCL (PF) 0.25 % IJ SOLN
30.0000 mL | Freq: Once | INTRAMUSCULAR | Status: AC
  Administered 2015-09-07: 13:00:00

## 2015-09-07 MED ORDER — BUPIVACAINE HCL (PF) 0.25 % IJ SOLN
INTRAMUSCULAR | Status: AC
  Filled 2015-09-07: qty 30

## 2015-09-07 MED ORDER — FENTANYL CITRATE (PF) 100 MCG/2ML IJ SOLN: 100.0000 ug | Freq: Once | INTRAMUSCULAR | Status: DC

## 2015-09-07 MED ORDER — MIDAZOLAM HCL 5 MG/5ML IJ SOLN: 5.0000 mg | Freq: Once | INTRAMUSCULAR | Status: DC

## 2015-09-07 MED ORDER — ORPHENADRINE CITRATE 30 MG/ML IJ SOLN: 60.0000 mg | Freq: Once | INTRAMUSCULAR | Status: DC

## 2015-09-07 NOTE — Progress Notes (Signed)
Safety precautions to be maintained throughout the outpatient stay will include: orient to surroundings, keep bed in low position, maintain call bell within reach at all times, provide assistance with transfer out of bed and ambulation.  

## 2015-09-07 NOTE — Patient Instructions (Addendum)
PLAN  Continue present medications  F/U PCP  Dr Keith Rake for evaluation of  BP and general medical  condition  F/U surgical evaluation. May consider pending follow-up evaluations  F/U neurological evaluation. May consider pending follow-up evaluations  Please ask the nurses and secretary the date of your lumbar MRI  May consider radiofrequency rhizolysis or intraspinal procedures pending response to present treatment and F/U evaluation   Patient to call [facility] should patient have concerns prior to scheduled return appointment. Pain Management Discharge Instructions  General Discharge Instructions :  If you need to reach your doctor call: Monday-Friday 8:00 am - 4:00 pm at [phone] or toll free [phone].  After clinic hours [phone] to have operator reach doctor.  Bring all of your medication bottles to all your appointments in the pain clinic.  To cancel or reschedule your appointment with Pain Management please remember to call 24 hours in advance to avoid a fee.  Refer to the educational materials which you have been given on: General Risks, I had my Procedure. Discharge Instructions, Post Sedation.  Post Procedure Instructions:  The drugs you were given will stay in your system until tomorrow, so for the next 24 hours you should not drive, make any legal decisions or drink any alcoholic beverages.  You may eat anything you prefer, but it is better to start with liquids then soups and crackers, and gradually work up to solid foods.  Please notify your doctor immediately if you have any unusual bleeding, trouble breathing or pain that is not related to your normal pain.  Depending on the type of procedure that was done, some parts of your body may feel week and/or numb.  This usually clears up by tonight or the next day.  Walk with the use of an assistive device or accompanied by an adult for the 24 hours.  You may use ice on the affected area for  the first 24 hours.  Put ice in a Ziploc bag and cover with a towel and place against area 15 minutes on 15 minutes off.  You may switch to heat after 24 hours.Sacroiliac (SI) Joint Injection Patient Information  Description: The sacroiliac joint connects the scrum (very low back and tailbone) to the ilium (a pelvic bone which also forms half of the hip joint).  Normally this joint experiences very little motion.  When this joint becomes inflamed or unstable low back and or hip and pelvis pain may result.  Injection of this joint with local anesthetics (numbing medicines) and steroids can provide diagnostic information and reduce pain.  This injection is performed with the aid of x-ray guidance into the tailbone area while you are lying on your stomach.   You may experience an electrical sensation down the leg while this is being done.  You may also experience numbness.  We also may ask if we are reproducing your normal pain during the injection.  Conditions which may be treated SI injection:   Low back, buttock, hip or leg pain  Preparation for the Injection:  1. Do not eat any solid food or dairy products within 8 hours of your appointment.  2. You may drink clear liquids up to 3 hours before appointment.  Clear liquids include water, black coffee, juice or soda.  No milk or cream please. 3. You may take your regular medications, including pain medications with a sip of water before your appointment.  Diabetics should hold regular insulin (if take separately) and take 1/2  normal NPH dose the morning of the procedure.  Carry some sugar containing items with you to your appointment. 4. A driver must accompany you and be prepared to drive you home after your procedure. 5. Bring all of your current medications with you. 6. An IV may be inserted and sedation may be given at the discretion of the physician. 7. A blood pressure cuff, EKG and other monitors will often be applied during the procedure.   Some patients may need to have extra oxygen administered for a short period.  8. You will be asked to provide medical information, including your allergies, prior to the procedure.  We must know immediately if you are taking blood thinners (like Coumadin/Warfarin) or if you are allergic to IV iodine contrast (dye).  We must know if you could possible be pregnant.  Possible side effects:   Bleeding from needle site  Infection (rare, may require surgery)  Nerve injury (rare)  Numbness & tingling (temporary)  A brief convulsion or seizure  Light-headedness (temporary)  Pain at injection site (several days)  Decreased blood pressure (temporary)  Weakness in the leg (temporary)   Call if you experience:   New onset weakness or numbness of an extremity below the injection site that last more than 8 hours.  Hives or difficulty breathing ( go to the emergency room)  Inflammation or drainage at the injection site  Any new symptoms which are concerning to you  Please note:  Although the local anesthetic injected can often make your back/ hip/ buttock/ leg feel good for several hours after the injections, the pain will likely return.  It takes 3-7 days for steroids to work in the sacroiliac area.  You may not notice any pain relief for at least that one week.  If effective, we will often do a series of three injections spaced 3-6 weeks apart to maximally decrease your pain.  After the initial series, we generally will wait some months before a repeat injection of the same type.  If you have any questions, please call [phone] [place]  What are the risk, side effects and possible complications? Generally speaking, most procedures are safe.  However, with any procedure there are risks, side effects, and the possibility of complications.  The risks and complications are dependent upon the sites that are  lesioned, or the type of nerve block to be performed.  The closer the procedure is to the spine, the more serious the risks are.  Great care is taken when placing the radio frequency needles, block needles or lesioning probes, but sometimes complications can occur. 1. Infection: Any time there is an injection through the skin, there is a risk of infection.  This is why sterile conditions are used for these blocks.  There are four possible types of infection. 1. Localized skin infection. 2. Central Nervous System Infection-This can be in the form of Meningitis, which can be deadly. 3. Epidural Infections-This can be in the form of an epidural abscess, which can cause pressure inside of the spine, causing compression of the spinal cord with subsequent paralysis. This would require an emergency surgery to decompress, and there are no guarantees that the patient would recover from the paralysis. 4. Discitis-This is an infection of the intervertebral discs.  It occurs in about 1% of discography procedures.  It is difficult to treat and it may lead to surgery.        2.  Pain: the needles have to go through skin and soft tissues, will cause soreness.       3. Damage to internal structures:  The nerves to be lesioned may be near blood vessels or    other nerves which can be potentially damaged.       4. Bleeding: Bleeding is more common if the patient is taking blood thinners such as  aspirin, Coumadin, Ticiid, Plavix, etc., or if he/she have some genetic predisposition  such as hemophilia. Bleeding into the spinal canal can cause compression of the spinal  cord with subsequent paralysis.  This would require an emergency surgery to  decompress and there are no guarantees that the patient would recover from the  paralysis.       5. Pneumothorax:  Puncturing of a lung is a possibility, every time a needle is introduced in  the area of the chest or upper back.  Pneumothorax refers to free air around the  collapsed  lung(s), inside of the thoracic cavity (chest cavity).  Another two possible  complications related to a similar event would include: Hemothorax and Chylothorax.   These are variations of the Pneumothorax, where instead of air around the collapsed  lung(s), you may have blood or chyle, respectively.       6. Spinal headaches: They may occur with any procedures in the area of the spine.       7. Persistent CSF (Cerebro-Spinal Fluid) leakage: This is a rare problem, but may occur  with prolonged intrathecal or epidural catheters either due to the formation of a fistulous  track or a dural tear.       8. Nerve damage: By working so close to the spinal cord, there is always a possibility of  nerve damage, which could be as serious as a permanent spinal cord injury with  paralysis.       9. Death:  Although rare, severe deadly allergic reactions known as "Anaphylactic  reaction" can occur to any of the medications used.      10. Worsening of the symptoms:  We can always make thing worse.  What are the chances of something like this happening? Chances of any of this occuring are extremely low.  By statistics, you have more of a chance of getting killed in a motor vehicle accident: while driving to the hospital than any of the above occurring .  Nevertheless, you should be aware that they are possibilities.  In general, it is similar to taking a shower.  Everybody knows that you can slip, hit your head and get killed.  Does that mean that you should not shower again?  Nevertheless always keep in mind that statistics do not mean anything if you happen to be on the wrong side of them.  Even if a procedure has a 1 (one) in a 1,000,000 (million) chance of going wrong, it you happen to be that one..Also, keep in mind that by statistics, you have more of a chance of having something go wrong when taking medications.  Who should not have this procedure? If you are on a blood thinning medication (e.g. Coumadin, Plavix,  see list of "Blood Thinners"), or if you have an active infection going on, you should not have the procedure.  If you are taking any blood thinners, please inform your physician.  How should I prepare for this procedure?  Do not eat or drink anything at least six hours prior to the procedure.  Bring a driver with  you .  It cannot be a taxi.  Come accompanied by an adult that can drive you back, and that is strong enough to help you if your legs get weak or numb from the local anesthetic.  Take all of your medicines the morning of the procedure with just enough water to swallow them.  If you have diabetes, make sure that you are scheduled to have your procedure done first thing in the morning, whenever possible.  If you have diabetes, take only half of your insulin dose and notify our nurse that you have done so as soon as you arrive at the clinic.  If you are diabetic, but only take blood sugar pills (oral hypoglycemic), then do not take them on the morning of your procedure.  You may take them after you have had the procedure.  Do not take aspirin or any aspirin-containing medications, at least eleven (11) days prior to the procedure.  They may prolong bleeding.  Wear loose fitting clothing that may be easy to take off and that you would not mind if it got stained with Betadine or blood.  Do not wear any jewelry or perfume  Remove any nail coloring.  It will interfere with some of our monitoring equipment.  NOTE: Remember that this is not meant to be interpreted as a complete list of all possible complications.  Unforeseen problems may occur.  BLOOD THINNERS The following drugs contain aspirin or other products, which can cause increased bleeding during surgery and should not be taken for 2 weeks prior to and 1 week after surgery.  If you should need take something for relief of minor pain, you may take acetaminophen which is found in Tylenol,m Datril, Anacin-3 and Panadol. It is not  blood thinner. The products listed below are.  Do not take any of the products listed below in addition to any listed on your instruction sheet.  A.P.C or A.P.C with Codeine Codeine Phosphate Capsules #3 Ibuprofen Ridaura  ABC compound Congesprin Imuran rimadil  Advil Cope Indocin Robaxisal  Alka-Seltzer Effervescent Pain Reliever and Antacid Coricidin or Coricidin-D  Indomethacin Rufen  Alka-Seltzer plus Cold Medicine Cosprin Ketoprofen S-A-C Tablets  Anacin Analgesic Tablets or Capsules Coumadin Korlgesic Salflex  Anacin Extra Strength Analgesic tablets or capsules CP-2 Tablets Lanoril Salicylate  Anaprox Cuprimine Capsules Levenox Salocol  Anexsia-D Dalteparin Magan Salsalate  Anodynos Darvon compound Magnesium Salicylate Sine-off  Ansaid Dasin Capsules Magsal Sodium Salicylate  Anturane Depen Capsules Marnal Soma  APF Arthritis pain formula Dewitt's Pills Measurin Stanback  Argesic Dia-Gesic Meclofenamic Sulfinpyrazone  Arthritis Bayer Timed Release Aspirin Diclofenac Meclomen Sulindac  Arthritis pain formula Anacin Dicumarol Medipren Supac  Analgesic (Safety coated) Arthralgen Diffunasal Mefanamic Suprofen  Arthritis Strength Bufferin Dihydrocodeine Mepro Compound Suprol  Arthropan liquid Dopirydamole Methcarbomol with Aspirin Synalgos  ASA tablets/Enseals Disalcid Micrainin Tagament  Ascriptin Doan's Midol Talwin  Ascriptin A/D Dolene Mobidin Tanderil  Ascriptin Extra Strength Dolobid Moblgesic Ticlid  Ascriptin with Codeine Doloprin or Doloprin with Codeine Momentum Tolectin  Asperbuf Duoprin Mono-gesic Trendar  Aspergum Duradyne Motrin or Motrin IB Triminicin  Aspirin plain, buffered or enteric coated Durasal Myochrisine Trigesic  Aspirin Suppositories Easprin Nalfon Trillsate  Aspirin with Codeine Ecotrin Regular or Extra Strength Naprosyn Uracel  Atromid-S Efficin Naproxen Ursinus  Auranofin Capsules Elmiron Neocylate Vanquish  Axotal Emagrin Norgesic Verin  Azathioprine  Empirin or Empirin with Codeine Normiflo Vitamin E  Azolid Emprazil Nuprin Voltaren  Bayer Aspirin plain, buffered or children's or timed BC Tablets or powders Encaprin  Orgaran Warfarin Sodium  Buff-a-Comp Enoxaparin Orudis Zorpin  Buff-a-Comp with Codeine Equegesic Os-Cal-Gesic   Buffaprin Excedrin plain, buffered or Extra Strength Oxalid   Bufferin Arthritis Strength Feldene Oxphenbutazone   Bufferin plain or Extra Strength Feldene Capsules Oxycodone with Aspirin   Bufferin with Codeine Fenoprofen Fenoprofen Pabalate or Pabalate-SF   Buffets II Flogesic Panagesic   Buffinol plain or Extra Strength Florinal or Florinal with Codeine Panwarfarin   Buf-Tabs Flurbiprofen Penicillamine   Butalbital Compound Four-way cold tablets Penicillin   Butazolidin Fragmin Pepto-Bismol   Carbenicillin Geminisyn Percodan   Carna Arthritis Reliever Geopen Persantine   Carprofen Gold's salt Persistin   Chloramphenicol Goody's Phenylbutazone   Chloromycetin Haltrain Piroxlcam   Clmetidine heparin Plaquenil   Cllnoril Hyco-pap Ponstel   Clofibrate Hydroxy chloroquine Propoxyphen         Before stopping any of these medications, be sure to consult the physician who ordered them.  Some, such as Coumadin (Warfarin) are ordered to prevent or treat serious conditions such as "deep thrombosis", "pumonary embolisms", and other heart problems.  The amount of time that you may need off of the medication may also vary with the medication and the reason for which you were taking it.  If you are taking any of these medications, please make sure you notify your pain physician before you undergo any procedures.

## 2015-09-07 NOTE — Progress Notes (Signed)
PROCEDURE:  Block of nerves to the sacroiliac joint.   NOTE:  The patient is a 40 y.o. female who returns to the [facility] for further evaluation and treatment of pain involving the lower back and lower extremity region with pain in the region of the buttocks as well. Prior MRI studies reveal  Degenerative disc disease lumbar region Rudimentary ribs at T12. Chronic appearing fractures T9 and T10. See  separate thoracic spine report.   L5 is incorporated the sacrum with transitional lumbosacral anatomy.  There is disc degeneration with disc space narrowing at L4-5.  Remaining disc spaces are normal. Negative for fracture or pars  defect.  Disc degeneration L4-5. L5 is a transitional segment, incorporated  into the sacrum. No acute lumbar spine abnormality.    Degenerative disc disease thoracic region Mild fractures at approximately T9 and T10. These are most likely  chronic. No acute fracture or mass. Disc spaces are maintained.  Minimal dextroscoliosis at T8-9.  Small cervical rib on the left at C7.  Rudimentary ribs at T12. Mild chronic appearing fractures T9 and T10.  No acute abnormality.. . There is reproduction of severe pain with palpation over the PSIS and PII S region.  There is concern regarding a significant component of the patient's pain being due to sacroiliac joint dysfunction The risks, benefits, expectations of the procedure have been discussed and explained to the patient who is understanding and willing to proceed with interventional treatment in attempt to decrease severity of patient's symptoms, minimize the risk of medication escalation and  hopefully retard the progression of the patient's symptoms. We will proceed with what is felt to be a medically necessary procedure, block of nerves to the sacroiliac joint.   DESCRIPTION OF PROCEDURE:  Block of nerves to the sacroiliac joint.   The patient was taken to the fluoroscopy suite. With the patient in  the prone position with EKG, blood pressure, pulse, capnography, and pulse oximetry monitoring, IV Versed, IV fentanyl conscious sedation, Betadine prep of proposed entry site was performed.   Block of nerves at the L5 vertebral body level.   With the patient in prone position, under fluoroscopic guidance, a 22 -gauge needle was inserted at the L5 vertebral body level on the left side. With 15 degrees oblique orientation a 22 -gauge needle was inserted in the region known as Burton's eye or eye of the Scotty dog. Following documentation of needle placement in the area of Burton's eye or eye of the Scotty dog under fluoroscopic guidance, needle placement was then accomplished at the sacral ala level on the left side.   Needle placement at the sacral ala.   With the patient in prone position under fluoroscopic guidance with AP view of the lumbosacral spine, a 22 -gauge needle was inserted in the region known as the sacral ala on the left side. Following documentation of needle placement on the left side under fluoroscopic guidance needle placement was then accomplished at the S1 foramen level.   Needle placement at the S1 foramen level.   With the patient in prone position under fluoroscopic guidance with AP view of the lumbosacral spine and cephalad orientation, a 22 -gauge needle was inserted at the superior and lateral border of the S1 foramen on the left side. Following documentation of needle placement at the S1 foramen level on the left side, needle placement was then accomplished at the S2 foramen level on the left side.   Needle placement at the S2  foramen level.   With the patient in prone position with AP view of the lumbosacral spine with cephalad orientation, a 22 - gauge needle was inserted at the superior and lateral border of the S2 foramen under fluoroscopic guidance on the left side. Following needle placement at the L5 vertebral body level, sacral ala, S1 foramen and S2 foramen on the  left side, needle placement was verified on lateral view under fluoroscopic guidance.  Following needle placement documentation on lateral view, each needle was injected with 1 mL of 0.25% bupivacaine and Kenalog.   BLOCK OF THE NERVES TO SACROILIAC JOINT ON THE RIGHT SIDE The procedure was performed on the right side at the same levels as was performed on the left side and utilizing the same technique as on the left side and was performed under fluoroscopic guidance as on the left side   A total of 26m of Kenalog was utilized for the procedure.   PLAN:  1. Medications: The patient will continue presently prescribed medications.  2. The patient will be considered for modification of treatment regimen pending response to the procedure performed on today's visit.  3. The patient is to follow-up with primary care physician Dr.S SManuella Ghazi for evaluation of blood pressure and general medical condition following the procedure performed on today's visit.  4. Surgical evaluation as discussed.  5. Neurological evaluation as discussed.  6. The patient may be a candidate for radiofrequency procedures, implantation devices and other treatment pending response to treatment performed on today's visit and follow-up evaluation.  7. The patient has been advised to adhere to proper body mechanics and to avoid activities which may exacerbate the patient's symptoms.   Return appointment to [facility] as scheduled.

## 2015-09-08 ENCOUNTER — Telehealth: Payer: Self-pay | Admitting: *Deleted

## 2015-09-08 NOTE — Telephone Encounter (Signed)
Left voice mail

## 2015-09-19 ENCOUNTER — Emergency Department
Admission: EM | Admit: 2015-09-19 | Discharge: 2015-09-19 | Disposition: A | Discharge status: Home / Self Care | Payer: BC Managed Care – PPO | Attending: Emergency Medicine | Admitting: Emergency Medicine

## 2015-09-19 ENCOUNTER — Encounter: Payer: Self-pay | Admitting: Medical Oncology

## 2015-09-19 DIAGNOSIS — Z87891 Personal history of nicotine dependence: Secondary | ICD-10-CM | POA: Insufficient documentation

## 2015-09-19 DIAGNOSIS — Z79899 Other long term (current) drug therapy: Secondary | ICD-10-CM | POA: Diagnosis not present

## 2015-09-19 DIAGNOSIS — M545 Low back pain: Secondary | ICD-10-CM | POA: Diagnosis not present

## 2015-09-19 DIAGNOSIS — Z791 Long term (current) use of non-steroidal anti-inflammatories (NSAID): Secondary | ICD-10-CM | POA: Insufficient documentation

## 2015-09-19 DIAGNOSIS — G8929 Other chronic pain: Secondary | ICD-10-CM | POA: Insufficient documentation

## 2015-09-19 DIAGNOSIS — M549 Dorsalgia, unspecified: Secondary | ICD-10-CM

## 2015-09-19 MED ORDER — NAPROXEN 500 MG PO TABS
500.0000 mg | ORAL_TABLET | Freq: Two times a day (BID) | ORAL | 0 refills | Status: DC
Start: 2015-09-19 — End: 2016-02-27

## 2015-09-19 MED ORDER — CYCLOBENZAPRINE HCL 10 MG PO TABS: 10.0000 mg | ORAL_TABLET | Freq: Three times a day (TID) | ORAL | 0 refills | Status: DC | PRN

## 2015-09-19 NOTE — Discharge Instructions (Signed)
Please call and schedule an appointment with orthopedics.

## 2015-09-19 NOTE — ED Triage Notes (Signed)
Lower back pain that is chronic, sees pain management usually but was told today to come to er. Pt denies injury.

## 2015-09-19 NOTE — ED Notes (Signed)
Having non radiating lower back pain for couple of days   States she was seen at pain clinic and had injection to back but states injection did not work. Denies recent injury but does a lot of bending at work  Also denies any urinary sx's

## 2015-09-19 NOTE — ED Provider Notes (Signed)
[facility] Emergency Department Provider Note ____________________________________________  Time seen: Approximately 8:47 AM  I have reviewed the triage vital signs and the nursing notes.   HISTORY  Chief Complaint Back Pain    HPI Kaitlyn Harvey is a 40 y.o. female who presents to the emergency department for evaluation of chronic lower back pain. She was a patient of Dr.Crisp who is no longer in practice at the pain clinic. She states that she called the office and was advised to come to the emergency department for evaluation. She has taken ibuprofen without any relief. She is also taking meloxicam in the past without any relief. She states that she had an MRI of her lower back. 2 years ago and has another one scheduled at the end of the month, but has been unable to get any pain relief with over-the-counter medicines. Pain increases with lateral bending.  History reviewed. No pertinent past medical history.  Patient Active Problem List   Diagnosis Date Noted  . DDD (degenerative disc disease), lumbar 08/09/2015  . Facet syndrome, lumbar 08/09/2015  . 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 Well woman exam with routine gynecological exam 02/28/2015  . Tobacco abuse 02/28/2015  . Screening for breast cancer 02/28/2015    Past Surgical History:  Procedure Laterality Date  . UPPER GASTROINTESTINAL ENDOSCOPY    . UPPER GI ENDOSCOPY      Prior to Admission medications   Medication Sig Start Date End Date Taking? Authorizing Provider  Biotin 10 MG CAPS Take 1 capsule by mouth daily.    Historical Provider, MD  cyclobenzaprine (FLEXERIL) 10 MG tablet Take 1 tablet (10 mg total) by mouth 3 (three) times daily as needed for muscle spasms. 09/19/15   Victorino Dike, FNP  ibuprofen (ADVIL,MOTRIN) 600 MG tablet Take 600 mg by mouth every 6 (six) hours as needed.    Historical Provider, MD  meloxicam (MOBIC) 15 MG tablet Take 15 mg by mouth daily.     Historical Provider, MD  methocarbamol (ROBAXIN) 500 MG tablet Take 500 mg by mouth 2 (two) times daily. 01/28/15   Historical Provider, MD  Multiple Vitamin (MULTIVITAMIN) tablet Take 1 tablet by mouth daily.    Historical Provider, MD  naproxen (NAPROSYN) 500 MG tablet Take 1 tablet (500 mg total) by mouth 2 (two) times daily with a meal. 09/19/15   Alexandera Kuntzman B Kelly Ranieri, FNP  nicotine (NICODERM CQ - DOSED IN MG/24 HOURS) 14 mg/24hr patch Place 1 patch (14 mg total) onto the skin daily. Patient not taking: Reported on 08/09/2015 02/28/15   Roselee Nova, MD  nicotine (NICODERM CQ - DOSED IN MG/24 HR) 7 mg/24hr patch Place 1 patch (7 mg total) onto the skin daily. Patient not taking: Reported on 08/09/2015 02/28/15   Roselee Nova, MD    Allergies Review of patient's allergies indicates no known allergies.  Family History  Problem Relation Age of Onset  . Asthma Mother   . Hypertension Mother   . Thyroid disease Mother   . Hypertension Brother   . Bell's palsy Brother     Social History Social History  Substance Use Topics  . Smoking status: Former Smoker    Packs/day: 0.25    Years: 20.00    Types: Cigarettes    Start date: 12/20/1994    Quit date: 06/09/2015  . Smokeless tobacco: Never Used     Comment: stopped smoking after 15 years   . Alcohol use 0.0 oz/week  Comment: occasionally    Review of Systems Constitutional: No recent illness. Cardiovascular: Denies chest pain or palpitations. Respiratory: Denies shortness of breath. Musculoskeletal: Pain in Lower back  Skin: Negative for rash, wound, lesion. Neurological: Negative for focal weakness or numbness.  ____________________________________________   PHYSICAL EXAM:  VITAL SIGNS: ED Triage Vitals [09/19/15 0810]  Enc Vitals Group     BP      Pulse      Resp      Temp      Temp src      SpO2      Weight 130 lb (59 kg)     Height 5' 5"  (1.651 m)     Head Circumference      Peak Flow      Pain Score 8      Pain Loc      Pain Edu?      Excl. in [place]?     Constitutional: Alert and oriented. Well appearing and in no acute distress. Eyes: Conjunctivae are normal. EOMI. Head: Atraumatic. Neck: No stridor.  Respiratory: Normal respiratory effort.   Musculoskeletal: Lumbar paraspinal tenderness. No focal midline tenderness.  Neurologic:  Normal speech and language. No gross focal neurologic deficits are appreciated. Speech is normal. No gait instability. Skin:  Skin is warm, dry and intact. Atraumatic. Psychiatric: Mood and affect are normal. Speech and behavior are normal.  ____________________________________________   LABS (all labs ordered are listed, but only abnormal results are displayed)  Labs Reviewed - No data to display ____________________________________________  RADIOLOGY  Not indicated. ____________________________________________   PROCEDURES  Procedure(s) performed: None   ____________________________________________   INITIAL IMPRESSION / ASSESSMENT AND PLAN / ED COURSE  Clinical Course    Pertinent labs & imaging results that were available during my care of the patient were reviewed by me and considered in my medical decision making (see chart for details).  Patient to be given prescriptions for flexeril and naprosyn. She is to follow up with orthopedics. She was advised to return to the ER for symptoms that change or worsen if unable to schedule an appointment.  ____________________________________________   FINAL CLINICAL IMPRESSION(S) / ED DIAGNOSES  Final diagnoses:  Chronic back pain       Victorino Dike, FNP 09/19/15 [place], MD 09/19/15 1614

## 2015-09-28 ENCOUNTER — Telehealth: Payer: Self-pay | Admitting: *Deleted

## 2015-09-30 ENCOUNTER — Ambulatory Visit: Payer: BC Managed Care – PPO

## 2015-10-21 ENCOUNTER — Ambulatory Visit: Payer: BC Managed Care – PPO | Admitting: Family Medicine

## 2016-01-31 ENCOUNTER — Encounter: Payer: Self-pay | Admitting: Family Medicine

## 2016-01-31 ENCOUNTER — Ambulatory Visit (INDEPENDENT_AMBULATORY_CARE_PROVIDER_SITE_OTHER): Payer: BC Managed Care – PPO | Admitting: Family Medicine

## 2016-01-31 VITALS — BP 116/68 | HR 98 | Temp 98.3°F | Resp 17 | Ht 65.0 in | Wt 137.3 lb

## 2016-01-31 DIAGNOSIS — N3001 Acute cystitis with hematuria: Secondary | ICD-10-CM | POA: Diagnosis not present

## 2016-01-31 DIAGNOSIS — N3000 Acute cystitis without hematuria: Secondary | ICD-10-CM | POA: Insufficient documentation

## 2016-01-31 DIAGNOSIS — B379 Candidiasis, unspecified: Secondary | ICD-10-CM | POA: Diagnosis not present

## 2016-01-31 DIAGNOSIS — T3695XA Adverse effect of unspecified systemic antibiotic, initial encounter: Secondary | ICD-10-CM | POA: Diagnosis not present

## 2016-01-31 LAB — POCT URINALYSIS DIPSTICK
BILIRUBIN UA: NEGATIVE
GLUCOSE UA: NEGATIVE
KETONES UA: NEGATIVE
Leukocytes, UA: NEGATIVE
Nitrite, UA: POSITIVE
Spec Grav, UA: 1.015
UROBILINOGEN UA: NEGATIVE
pH, UA: 5

## 2016-01-31 MED ORDER — FLUCONAZOLE 150 MG PO TABS
150.0000 mg | ORAL_TABLET | Freq: Once | ORAL | 0 refills | Status: AC
Start: 2016-01-31 — End: 2016-01-31

## 2016-01-31 MED ORDER — NITROFURANTOIN MACROCRYSTAL 100 MG PO CAPS: 100.0000 mg | ORAL_CAPSULE | Freq: Two times a day (BID) | ORAL | 0 refills | Status: DC

## 2016-01-31 NOTE — Progress Notes (Signed)
Name: Kaitlyn Harvey   MRN: [id number]    DOB: October 05, 1975   Date:01/31/2016       Progress Note  Subjective  Chief Complaint  Chief Complaint  Patient presents with  . Urinary Tract Infection    Urinary Tract Infection   This is a new problem. The current episode started in the past 7 days. The problem has been unchanged. The quality of the pain is described as aching. The pain is moderate. There has been no fever. There is no history of pyelonephritis. Associated symptoms include hematuria, hesitancy and urgency. Pertinent negatives include no chills. She has tried increased fluids and home medications (Azo and cranberry juice.) for the symptoms.    History reviewed. No pertinent past medical history.  Past Surgical History:  Procedure Laterality Date  . UPPER GASTROINTESTINAL ENDOSCOPY    . UPPER GI ENDOSCOPY      Family History  Problem Relation Age of Onset  . Asthma Mother   . Hypertension Mother   . Thyroid disease Mother   . Hypertension Brother   . Bell's palsy Brother     Social History   Social History  . Marital status: Single    Spouse name: N/A  . Number of children: N/A  . Years of education: N/A   Occupational History  . Not on file.   Social History Main Topics  . Smoking status: Former Smoker    Packs/day: 0.25    Years: 20.00    Types: Cigarettes    Start date: 12/20/1994    Quit date: 06/09/2015  . Smokeless tobacco: Never Used     Comment: stopped smoking after 15 years   . Alcohol use 0.0 oz/week     Comment: occasionally  . Drug use: No  . Sexual activity: Yes    Partners: Male   Other Topics Concern  . Not on file   Social History Narrative  . No narrative on file     Current Outpatient Prescriptions:  .  Biotin 10 MG CAPS, Take 1 capsule by mouth daily., Disp: , Rfl:  .  cyclobenzaprine (FLEXERIL) 10 MG tablet, Take 1 tablet (10 mg total) by mouth 3 (three) times daily as needed for muscle spasms., Disp: 30 tablet, Rfl: 0 .   ibuprofen (ADVIL,MOTRIN) 600 MG tablet, Take 600 mg by mouth every 6 (six) hours as needed., Disp: , Rfl:  .  meloxicam (MOBIC) 15 MG tablet, Take 15 mg by mouth daily., Disp: , Rfl:  .  Multiple Vitamin (MULTIVITAMIN) tablet, Take 1 tablet by mouth daily., Disp: , Rfl:  .  naproxen (NAPROSYN) 500 MG tablet, Take 1 tablet (500 mg total) by mouth 2 (two) times daily with a meal., Disp: 30 tablet, Rfl: 0 .  methocarbamol (ROBAXIN) 500 MG tablet, Take 500 mg by mouth 2 (two) times daily., Disp: , Rfl: 3 .  nicotine (NICODERM CQ - DOSED IN MG/24 HOURS) 14 mg/24hr patch, Place 1 patch (14 mg total) onto the skin daily. (Patient not taking: Reported on 08/09/2015), Disp: 42 patch, Rfl: 0 .  nicotine (NICODERM CQ - DOSED IN MG/24 HR) 7 mg/24hr patch, Place 1 patch (7 mg total) onto the skin daily. (Patient not taking: Reported on 08/09/2015), Disp: 14 patch, Rfl: 0  Current Facility-Administered Medications:  .  fentaNYL (SUBLIMAZE) injection 100 mcg, 100 mcg, Intravenous, Once, Mohammed Kindle, MD .  lactated ringers infusion 1,000 mL, 1,000 mL, Intravenous, Continuous, Mohammed Kindle, MD .  midazolam (VERSED) 5 MG/5ML injection 5 mg, 5  mg, Intravenous, Once, Mohammed Kindle, MD .  orphenadrine (NORFLEX) injection 60 mg, 60 mg, Intramuscular, Once, Mohammed Kindle, MD .  triamcinolone acetonide (KENALOG-40) injection 40 mg, 40 mg, Other, Once, Mohammed Kindle, MD  No Known Allergies   Review of Systems  Constitutional: Negative for chills.  Genitourinary: Positive for hematuria, hesitancy and urgency.    Objective  Vitals:   01/31/16 1556  BP: 116/68  Pulse: 98  Resp: 17  Temp: 98.3 F (36.8 C)  TempSrc: Oral  SpO2: 99%  Weight: 137 lb 4.8 oz (62.3 kg)  Height: 5' 5"  (1.651 m)    Physical Exam  Constitutional: She is oriented to person, place, and time and well-developed, well-nourished, and in no distress.  Cardiovascular: Normal rate, regular rhythm and normal heart sounds.   Pulmonary/Chest:  Effort normal and breath sounds normal. She has no wheezes.  Abdominal: Soft. Bowel sounds are normal. There is tenderness in the suprapubic area. There is no guarding and no CVA tenderness.  Neurological: She is alert and oriented to person, place, and time.  Psychiatric: Mood, memory, affect and judgment normal.  Nursing note and vitals reviewed.   Recent Results (from the past 2160 hour(s))  POCT urinalysis dipstick     Status: None   Collection Time: 01/31/16  4:05 PM  Result Value Ref Range   Color, UA yellow    Clarity, UA cloudy    Glucose, UA negative    Bilirubin, UA negative    Ketones, UA negative    Spec Grav, UA 1.015    Blood, UA moderate    pH, UA 5.0    Protein, UA trace    Urobilinogen, UA negative    Nitrite, UA positive    Leukocytes, UA Negative Negative     Assessment & Plan  1. Acute cystitis with hematuria Symptoms suggestive of a UTI, will start on Macrobid, obtain urinalysis and culture - POCT urinalysis dipstick - nitrofurantoin (MACRODANTIN) 100 MG capsule; Take 1 capsule (100 mg total) by mouth 2 (two) times daily.  Dispense: 14 capsule; Refill: 0 - Urine Culture - Urinalysis, Routine w reflex microscopic  2. Antibiotic-induced yeast infection  - fluconazole (DIFLUCAN) 150 MG tablet; Take 1 tablet (150 mg total) by mouth once.  Dispense: 1 tablet; Refill: 0   Ambra Haverstick [facility] 01/31/2016 4:08 PM

## 2016-02-02 LAB — URINE CULTURE

## 2016-02-11 ENCOUNTER — Other Ambulatory Visit: Payer: Self-pay | Admitting: Family Medicine

## 2016-02-11 DIAGNOSIS — N3001 Acute cystitis with hematuria: Secondary | ICD-10-CM

## 2016-02-23 ENCOUNTER — Encounter: Payer: BC Managed Care – PPO | Admitting: Family Medicine

## 2016-02-27 ENCOUNTER — Ambulatory Visit (INDEPENDENT_AMBULATORY_CARE_PROVIDER_SITE_OTHER): Payer: BC Managed Care – PPO | Admitting: Family Medicine

## 2016-02-27 ENCOUNTER — Encounter: Payer: Self-pay | Admitting: Family Medicine

## 2016-02-27 VITALS — BP 120/66 | HR 89 | Temp 98.3°F | Resp 17 | Ht 65.0 in | Wt 127.7 lb

## 2016-02-27 DIAGNOSIS — R011 Cardiac murmur, unspecified: Secondary | ICD-10-CM | POA: Diagnosis not present

## 2016-02-27 DIAGNOSIS — M5136 Other intervertebral disc degeneration, lumbar region: Secondary | ICD-10-CM | POA: Diagnosis not present

## 2016-02-27 DIAGNOSIS — Z0000 Encounter for general adult medical examination without abnormal findings: Secondary | ICD-10-CM | POA: Diagnosis not present

## 2016-02-27 DIAGNOSIS — N3000 Acute cystitis without hematuria: Secondary | ICD-10-CM

## 2016-02-27 LAB — POCT URINALYSIS DIPSTICK
BILIRUBIN UA: NEGATIVE
Blood, UA: NEGATIVE
Glucose, UA: NEGATIVE
KETONES UA: NEGATIVE
Leukocytes, UA: NEGATIVE
Nitrite, UA: NEGATIVE
Spec Grav, UA: 1.01
Urobilinogen, UA: 0.2
pH, UA: 8

## 2016-02-27 MED ORDER — NAPROXEN 500 MG PO TABS: 500.0000 mg | ORAL_TABLET | Freq: Every day | ORAL | 0 refills | Status: DC | PRN

## 2016-02-27 MED ORDER — CYCLOBENZAPRINE HCL 10 MG PO TABS: 10.0000 mg | ORAL_TABLET | Freq: Every day | ORAL | 0 refills | Status: DC | PRN

## 2016-02-27 NOTE — Progress Notes (Signed)
Name: Kaitlyn Harvey   MRN: [id number]    DOB: 02-Dec-1975   Date:02/27/2016       Progress Note  Subjective  Chief Complaint  Chief Complaint  Patient presents with  . Annual Exam    CPE    HPI  Pt. Is here for Complete Physical Exam.  She had a normal Pap Smear in 2017 and a normal mammogram in 2015.Marland Kitchen   History reviewed. No pertinent past medical history.  Past Surgical History:  Procedure Laterality Date  . UPPER GASTROINTESTINAL ENDOSCOPY    . UPPER GI ENDOSCOPY      Family History  Problem Relation Age of Onset  . Asthma Mother   . Hypertension Mother   . Thyroid disease Mother   . Hypertension Brother   . Bell's palsy Brother     Social History   Social History  . Marital status: Single    Spouse name: N/A  . Number of children: N/A  . Years of education: N/A   Occupational History  . Not on file.   Social History Main Topics  . Smoking status: Former Smoker    Packs/day: 0.25    Years: 20.00    Types: Cigarettes    Start date: 12/20/1994    Quit date: 06/09/2015  . Smokeless tobacco: Never Used     Comment: stopped smoking after 15 years   . Alcohol use 0.0 oz/week     Comment: occasionally  . Drug use: No  . Sexual activity: Yes    Partners: Male   Other Topics Concern  . Not on file   Social History Narrative  . No narrative on file     Current Outpatient Prescriptions:  .  Biotin 10 MG CAPS, Take 1 capsule by mouth daily., Disp: , Rfl:  .  ibuprofen (ADVIL,MOTRIN) 600 MG tablet, Take 600 mg by mouth every 6 (six) hours as needed., Disp: , Rfl:  .  methocarbamol (ROBAXIN) 500 MG tablet, Take 500 mg by mouth 2 (two) times daily., Disp: , Rfl: 3 .  Multiple Vitamin (MULTIVITAMIN) tablet, Take 1 tablet by mouth daily., Disp: , Rfl:  .  naproxen (NAPROSYN) 500 MG tablet, Take 1 tablet (500 mg total) by mouth 2 (two) times daily with a meal., Disp: 30 tablet, Rfl: 0 .  cyclobenzaprine (FLEXERIL) 10 MG tablet, Take 1 tablet (10 mg total) by  mouth 3 (three) times daily as needed for muscle spasms. (Patient not taking: Reported on 02/27/2016), Disp: 30 tablet, Rfl: 0 .  meloxicam (MOBIC) 15 MG tablet, Take 15 mg by mouth daily., Disp: , Rfl:  .  nicotine (NICODERM CQ - DOSED IN MG/24 HOURS) 14 mg/24hr patch, Place 1 patch (14 mg total) onto the skin daily. (Patient not taking: Reported on 08/09/2015), Disp: 42 patch, Rfl: 0 .  nicotine (NICODERM CQ - DOSED IN MG/24 HR) 7 mg/24hr patch, Place 1 patch (7 mg total) onto the skin daily. (Patient not taking: Reported on 08/09/2015), Disp: 14 patch, Rfl: 0 .  nitrofurantoin (MACRODANTIN) 100 MG capsule, Take 1 capsule (100 mg total) by mouth 2 (two) times daily. (Patient not taking: Reported on 02/27/2016), Disp: 14 capsule, Rfl: 0  Current Facility-Administered Medications:  .  fentaNYL (SUBLIMAZE) injection 100 mcg, 100 mcg, Intravenous, Once, Mohammed Kindle, MD .  lactated ringers infusion 1,000 mL, 1,000 mL, Intravenous, Continuous, Mohammed Kindle, MD .  midazolam (VERSED) 5 MG/5ML injection 5 mg, 5 mg, Intravenous, Once, Mohammed Kindle, MD .  orphenadrine (NORFLEX) injection 60 mg,  60 mg, Intramuscular, Once, Mohammed Kindle, MD .  triamcinolone acetonide [facility]) injection 40 mg, 40 mg, Other, Once, Mohammed Kindle, MD  No Known Allergies   Review of Systems  Constitutional: Negative for chills, fever and malaise/fatigue.  HENT: Negative for congestion and sore throat.   Eyes: Negative for blurred vision and double vision.  Respiratory: Negative for cough and shortness of breath.   Cardiovascular: Negative for chest pain and leg swelling.  Gastrointestinal: Negative for blood in stool, constipation, nausea and vomiting.  Genitourinary: Negative for dysuria, frequency, hematuria and urgency.  Musculoskeletal: Positive for back pain (chronic intermittent low back pain). Negative for joint pain and neck pain.  Neurological: Negative for dizziness and headaches.  Psychiatric/Behavioral:  Negative for depression. The patient is not nervous/anxious and does not have insomnia.     Objective  Vitals:   02/27/16 1409  BP: 120/66  Pulse: 89  Resp: 17  Temp: 98.3 F (36.8 C)  TempSrc: Oral  SpO2: 97%  Weight: 127 lb 11.2 oz (57.9 kg)  Height: 5' 5"  (1.651 m)    Physical Exam  Constitutional: She is oriented to person, place, and time and well-developed, well-nourished, and in no distress.  HENT:  Head: Normocephalic and atraumatic.  Cardiovascular: Normal rate, regular rhythm, S1 normal and S2 normal.   Murmur heard.  Systolic murmur is present with a grade of 2/6  Pulmonary/Chest: Effort normal and breath sounds normal. She has no wheezes. She has no rhonchi.  Abdominal: Soft. Bowel sounds are normal. There is no tenderness.  Musculoskeletal:       Right ankle: She exhibits no swelling.       Left ankle: She exhibits no swelling.  Neurological: She is alert and oriented to person, place, and time.  Skin: Skin is warm and dry.  Psychiatric: Mood, memory, affect and judgment normal.  Nursing note and vitals reviewed.    Assessment & Plan  1. Well woman exam without gynecological exam Obtain age-appropriate lab screenings. - Lipid panel - COMPLETE METABOLIC PANEL WITH GFR - TSH - VITAMIN D 25 Hydroxy (Vit-D Deficiency, Fractures)  2. Acute cystitis without hematuria Urinalysis shows no evidence of UTI. - POCT Urinalysis Dipstick  3. DDD (degenerative disc disease), lumbar She takes Naproxen 55m daily and Cyclobenzaprine 10 mg daily as needed for intermittent chronic low back pain. - naproxen (NAPROSYN) 500 MG tablet; Take 1 tablet (500 mg total) by mouth daily as needed.  Dispense: 30 tablet; Refill: 0 - cyclobenzaprine (FLEXERIL) 10 MG tablet; Take 1 tablet (10 mg total) by mouth daily as needed for muscle spasms.  Dispense: 30 tablet; Refill: 0  4. Cardiac murmur To Cardiology for Echo - Ambulatory referral to Cardiology    [facility] Center[facility].  [facility] 02/27/2016 2:23 PM

## 2016-03-05 LAB — COMPLETE METABOLIC PANEL WITH GFR
ALT: 13 U/L (ref 6–29)
AST: 26 U/L (ref 10–30)
Albumin: 4.6 g/dL (ref 3.6–5.1)
Alkaline Phosphatase: 36 U/L (ref 33–115)
BILIRUBIN TOTAL: 0.4 mg/dL (ref 0.2–1.2)
BUN: 25 mg/dL (ref 7–25)
CO2: 29 mmol/L (ref 20–31)
Calcium: 9.8 mg/dL (ref 8.6–10.2)
Chloride: 102 mmol/L (ref 98–110)
Creat: 0.92 mg/dL (ref 0.50–1.10)
GFR, EST NON AFRICAN AMERICAN: 77 mL/min (ref >=60)
GFR, Est African American: 89 mL/min (ref >=60)
GLUCOSE: 83 mg/dL (ref 65–99)
Potassium: 5.7 mmol/L — ABNORMAL HIGH (ref 3.5–5.3)
SODIUM: 138 mmol/L (ref 135–146)
Total Protein: 6.9 g/dL (ref 6.1–8.1)

## 2016-03-05 LAB — LIPID PANEL
Cholesterol: 201 mg/dL — ABNORMAL HIGH (ref <200)
HDL: 123 mg/dL (ref >50)
LDL CALC: 70 mg/dL (ref <100)
Total CHOL/HDL Ratio: 1.6 Ratio (ref <5.0)
Triglycerides: 41 mg/dL (ref <150)
VLDL: 8 mg/dL (ref <30)

## 2016-03-05 LAB — TSH: TSH: 4.44 m[IU]/L

## 2016-03-06 LAB — VITAMIN D 25 HYDROXY (VIT D DEFICIENCY, FRACTURES): Vit D, 25-Hydroxy: 32 ng/mL (ref 30–100)

## 2016-03-14 ENCOUNTER — Ambulatory Visit: Payer: BC Managed Care – PPO | Admitting: Internal Medicine

## 2016-03-25 ENCOUNTER — Other Ambulatory Visit: Payer: Self-pay | Admitting: Family Medicine

## 2016-03-25 DIAGNOSIS — M5136 Other intervertebral disc degeneration, lumbar region: Secondary | ICD-10-CM

## 2016-03-28 ENCOUNTER — Other Ambulatory Visit: Payer: Self-pay | Admitting: Family Medicine

## 2016-03-28 DIAGNOSIS — M5136 Other intervertebral disc degeneration, lumbar region: Secondary | ICD-10-CM

## 2016-04-17 ENCOUNTER — Ambulatory Visit: Payer: BC Managed Care – PPO | Admitting: Internal Medicine

## 2016-04-28 ENCOUNTER — Other Ambulatory Visit: Payer: Self-pay | Admitting: Family Medicine

## 2016-04-28 DIAGNOSIS — M5136 Other intervertebral disc degeneration, lumbar region: Secondary | ICD-10-CM

## 2016-05-08 ENCOUNTER — Encounter: Payer: Self-pay | Admitting: Internal Medicine

## 2016-05-08 ENCOUNTER — Ambulatory Visit (INDEPENDENT_AMBULATORY_CARE_PROVIDER_SITE_OTHER): Payer: BC Managed Care – PPO | Admitting: Internal Medicine

## 2016-05-08 VITALS — BP 126/86 | HR 77 | Ht 65.0 in | Wt 131.2 lb

## 2016-05-08 DIAGNOSIS — R002 Palpitations: Secondary | ICD-10-CM | POA: Diagnosis not present

## 2016-05-08 DIAGNOSIS — R011 Cardiac murmur, unspecified: Secondary | ICD-10-CM | POA: Diagnosis not present

## 2016-05-08 NOTE — Patient Instructions (Signed)
Medication Instructions:  Your physician recommends that you continue on your current medications as directed. Please refer to the Current Medication list given to you today.   Labwork: none  Testing/Procedures: Your physician has requested that you have an echocardiogram. Echocardiography is a painless test that uses sound waves to create images of your heart. It provides your doctor with information about the size and shape of your heart and how well your heart's chambers and valves are working. This procedure takes approximately one hour. There are no restrictions for this procedure.    Follow-Up: Your physician recommends that you schedule a follow-up appointment ON AN AS NEEDED BASIS.   Echocardiogram An echocardiogram, or echocardiography, uses sound waves (ultrasound) to produce an image of your heart. The echocardiogram is simple, painless, obtained within a short period of time, and offers valuable information to your health care provider. The images from an echocardiogram can provide information such as:  Evidence of coronary artery disease (CAD).  Heart size.  Heart muscle function.  Heart valve function.  Aneurysm detection.  Evidence of a past heart attack.  Fluid buildup around the heart.  Heart muscle thickening.  Assess heart valve function. Tell a health care provider about:  Any allergies you have.  All medicines you are taking, including vitamins, herbs, eye drops, creams, and over-the-counter medicines.  Any problems you or family members have had with anesthetic medicines.  Any blood disorders you have.  Any surgeries you have had.  Any medical conditions you have.  Whether you are pregnant or may be pregnant. What happens before the procedure? No special preparation is needed. Eat and drink normally. What happens during the procedure?  In order to produce an image of your heart, gel will be applied to your chest and a wand-like tool  (transducer) will be moved over your chest. The gel will help transmit the sound waves from the transducer. The sound waves will harmlessly bounce off your heart to allow the heart images to be captured in real-time motion. These images will then be recorded.  You may need an IV to receive a medicine that improves the quality of the pictures. What happens after the procedure? You may return to your normal schedule including diet, activities, and medicines, unless your health care provider tells you otherwise. This information is not intended to replace advice given to you by your health care provider. Make sure you discuss any questions you have with your health care provider. Document Released: 12/16/1999 Document Revised: 08/06/2015 Document Reviewed: 08/25/2012 Elsevier Interactive Patient Education  2017 [employer].

## 2016-05-08 NOTE — Progress Notes (Signed)
New Outpatient Visit Date: 05/08/2016  Referring Provider: Roselee Nova, MD [address] [place], [place] [address]  Chief Complaint: Heart murmur  HPI:  Ms. Kaitlyn Harvey is a 41 y.o. female who is being seen today for the evaluation of heart murmur at the request of Dr. Manuella Ghazi. She has a history of back pain secondary to arthritis. She was recently evaluated by Dr. Manuella Ghazi and was noted to have a murmur on exam. Ms. Kaitlyn Harvey notes that she has been told about a heart murmur all the way back to childhood but denies prior cardiac imaging for further evaluation. She denies chest pain, shortness of breath, edema, orthopnea, PND, and lightheadedness. She experiences occasional brief palpitations or "flutters" in her chest when she is anxious or active. Ms. Kaitlyn Harvey consumes 1 cup of caffeinated coffee/day. 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 She exercises regularly without any limitations. She will be getting married in [place] next month.  --------------------------------------------------------------------------------------------------  Cardiovascular History & Procedures: Cardiovascular Problems:  Heart murmur  Palpitations  Risk Factors:  None  Cath/PCI:  None  CV Surgery:  None  EP Procedures and Devices:  None  Non-Invasive Evaluation(s):  None  Recent CV Pertinent Labs: Lab Results  Component Value Date   CHOL 201 (H) 03/05/2016   CHOL 169 03/08/2015   HDL 123 03/05/2016   HDL 112 03/08/2015   LDLCALC 70 03/05/2016   LDLCALC 42 03/08/2015   TRIG 41 03/05/2016   CHOLHDL 1.6 03/05/2016   K 5.7 (H) 03/05/2016   BUN 25 03/05/2016   BUN 11 03/08/2015   CREATININE 0.92 03/05/2016    --------------------------------------------------------------------------------------------------  Past Medical History:  Diagnosis Date  . Arthritis    chronic  back pain    Past Surgical History:  Procedure Laterality Date  . UPPER GASTROINTESTINAL ENDOSCOPY    . UPPER GI ENDOSCOPY      Outpatient Encounter Prescriptions as of 05/08/2016  Medication Sig  . Biotin 10 MG CAPS Take 1 capsule by mouth daily.  Marland Kitchen ibuprofen (ADVIL,MOTRIN) 600 MG tablet Take 600 mg by mouth every 6 (six) hours as needed.  . Multiple Vitamin (MULTIVITAMIN) tablet Take 1 tablet by mouth daily.  . naproxen (NAPROSYN) 500 MG tablet TAKE 1 TABLET (500 MG TOTAL) BY MOUTH DAILY AS NEEDED.  Marland Kitchen cyclobenzaprine (FLEXERIL) 10 MG tablet TAKE 1 TABLET (10 MG TOTAL) BY MOUTH DAILY AS NEEDED FOR MUSCLE SPASMS. (Patient not taking: Reported on 05/08/2016)  . [DISCONTINUED] meloxicam (MOBIC) 15 MG tablet Take 15 mg by mouth daily.  . [DISCONTINUED] methocarbamol (ROBAXIN) 500 MG tablet Take 500 mg by mouth 2 (two) times daily.  . [DISCONTINUED] nicotine (NICODERM CQ - DOSED IN MG/24 HOURS) 14 mg/24hr patch Place 1 patch (14 mg total) onto the skin daily. (Patient not taking: Reported on 05/08/2016)  . [DISCONTINUED] nicotine (NICODERM CQ - DOSED IN MG/24 HR) 7 mg/24hr patch Place 1 patch (7 mg total) onto the skin daily. (Patient not taking: Reported on 05/08/2016)  . [DISCONTINUED] nitrofurantoin (MACRODANTIN) 100 MG capsule Take 1 capsule (100 mg total) by mouth 2 (two) times daily. (Patient not taking: Reported on 05/08/2016)   Facility-Administered Encounter Medications as of 05/08/2016  Medication  . fentaNYL (SUBLIMAZE) injection 100 mcg  . lactated ringers infusion 1,000 mL  . midazolam (VERSED) 5 MG/5ML injection 5 mg  . orphenadrine (NORFLEX) injection 60 mg  . triamcinolone  acetonide (KENALOG-40) injection 40 mg    Allergies: Patient has no known allergies.  Social History   Social History  . Marital status: Single    Spouse name: N/A  . Number of children: N/A  . Years of education: N/A   Occupational History  . Not on file.   Social History Main Topics  . Smoking status:  Former Smoker    Packs/day: 0.50    Years: 20.00    Types: Cigarettes    Start date: 12/20/1994    Quit date: 06/09/2015  . Smokeless tobacco: Never Used  . Alcohol use 1.2 oz/week    2 Glasses of wine per week  . Drug use: No  . Sexual activity: Yes    Partners: Male   Other Topics Concern  . Not on file   Social History Narrative  . No narrative on file    Family History  Problem Relation Age of Onset  . Asthma Mother   . Hypertension Mother   . Thyroid disease Mother   . Heart attack Father 27  . Hypertension Brother   . Bell's palsy Brother   . Heart murmur Brother     Review of Systems: A 12-system review of systems was performed and was negative except as noted in the HPI.  --------------------------------------------------------------------------------------------------  Physical Exam: BP 126/86 (BP Location: Right Arm, Patient Position: Sitting, Cuff Size: Normal)   Pulse 77   Ht 5' 5"  (1.651 m)   Wt 131 lb 4 oz (59.5 kg)   BMI 21.84 kg/m   General:  Slender woman, seated comfortably in the exam room. HEENT: No conjunctival pallor or scleral icterus.  Moist mucous membranes.  OP clear. Neck: Supple without lymphadenopathy, thyromegaly, JVD, or HJR.  No carotid bruit. Lungs: Normal work of breathing.  Clear to auscultation bilaterally without wheezes or crackles. Heart: Regular rate and rhythm with normal E8/B1. 2/6 holosystolic murmur loudest at the LLSB. No rubs or gallops.  Non-displaced PMI. Abd: Bowel sounds present.  Soft, NT/ND without hepatosplenomegaly Ext: No lower extremity edema.  Radial, PT, and DP pulses are 2+ bilaterally Skin: warm and dry without rash Neuro: CNIII-XII intact.  Strength and fine-touch sensation intact in upper and lower extremities bilaterally. Psych: Normal mood and affect.  EKG:  Normal sinus rhythm without abnormalities. No prior tracing available for comparison.  Lab Results  Component Value Date   WBC 5.9 03/08/2015    HCT 39.2 03/08/2015   MCV 100 (H) 03/08/2015   PLT 306 03/08/2015    Lab Results  Component Value Date   NA 138 03/05/2016   K 5.7 (H) 03/05/2016   CL 102 03/05/2016   CO2 29 03/05/2016   BUN 25 03/05/2016   CREATININE 0.92 03/05/2016   GLUCOSE 83 03/05/2016   ALT 13 03/05/2016    Lab Results  Component Value Date   CHOL 201 (H) 03/05/2016   HDL 123 03/05/2016   LDLCALC 70 03/05/2016   TRIG 41 03/05/2016   CHOLHDL 1.6 03/05/2016   --------------------------------------------------------------------------------------------------  ASSESSMENT AND PLAN: Heart murmur Soft holosystolic murmur is noted on exam today. Ms. Kaitlyn Harvey reports having been told of a heart murmur for many years but has never undergone prior cardiac imaging. She does not have any signs of symptoms of heart failure. However, she notes that her brother and several more distant relatives have a history of heart murmurs as well, though details are uncertain. I recommend a transthoracic echocardiogram for further evaluation of potential valvular heart disease or  other structural abnormalities.  Palpitations Ms. Sansone notes occasional brief palpitations or "flutters" when anxious or very active. There are no worrisome symptoms accompanying her chest pain. We will proceed with echo, as above. No additional work-up at this time.  Follow-up: Return to clinic as needed, based on results of echo and symptoms.  Nelva Bush, MD 05/09/2016 5:51 PM

## 2016-05-09 DIAGNOSIS — R011 Cardiac murmur, unspecified: Secondary | ICD-10-CM | POA: Insufficient documentation

## 2016-05-09 DIAGNOSIS — R002 Palpitations: Secondary | ICD-10-CM | POA: Insufficient documentation

## 2016-06-10 ENCOUNTER — Other Ambulatory Visit: Payer: Self-pay | Admitting: Family Medicine

## 2016-06-10 DIAGNOSIS — M5136 Other intervertebral disc degeneration, lumbar region: Secondary | ICD-10-CM

## 2016-06-15 ENCOUNTER — Other Ambulatory Visit: Payer: BC Managed Care – PPO

## 2016-06-19 ENCOUNTER — Encounter: Payer: Self-pay | Admitting: Family Medicine

## 2016-06-19 ENCOUNTER — Ambulatory Visit (INDEPENDENT_AMBULATORY_CARE_PROVIDER_SITE_OTHER): Payer: BC Managed Care – PPO | Admitting: Family Medicine

## 2016-06-19 VITALS — BP 122/70 | HR 74 | Temp 97.9°F | Resp 16 | Ht 65.0 in | Wt 130.2 lb

## 2016-06-19 DIAGNOSIS — N898 Other specified noninflammatory disorders of vagina: Secondary | ICD-10-CM

## 2016-06-19 NOTE — Progress Notes (Addendum)
Name: Kaitlyn Harvey   MRN: [id number]    DOB: Jul 26, 1975   Date:06/19/2016       Progress Note  Subjective  Chief Complaint  Chief Complaint  Patient presents with  . Vaginitis    yeast or bacteria infection, odor, discharge for 3 weeks    HPI  Pt presents with 1 month history of vaginal irritation (no itching and no pain), vaginal discharge that is white and more than usual and some foul odor. No abdominal pain, NVD, dysuria, no pain with intercourse.  No concern for STI, is monogomous with her fiance and declines testing today Pt leaves Friday 06/22/2016 for [place] to get married.  Patient Active Problem List   Diagnosis Date Noted  . Murmur 05/09/2016  . Palpitations 05/09/2016  . Acute cystitis 01/31/2016  . Antibiotic-induced yeast infection 01/31/2016  . DDD (degenerative disc disease), lumbar 08/09/2015  . 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 Well woman exam without gynecological exam 02/28/2015  . Tobacco abuse 02/28/2015  . Screening for breast cancer 02/28/2015    Social History  Substance Use Topics  . Smoking status: Former Smoker    Packs/day: 0.50    Years: 20.00    Types: Cigarettes    Start date: 12/20/1994    Quit date: 06/09/2015  . Smokeless tobacco: Never Used  . Alcohol use 1.2 oz/week    2 Glasses of wine per week     Current Outpatient Prescriptions:  .  Biotin 10 MG CAPS, Take 1 capsule by mouth daily., Disp: , Rfl:  .  ibuprofen (ADVIL,MOTRIN) 600 MG tablet, Take 600 mg by mouth every 6 (six) hours as needed., Disp: , Rfl:  .  Multiple Vitamin (MULTIVITAMIN) tablet, Take 1 tablet by mouth daily., Disp: , Rfl:  .  naproxen (NAPROSYN) 500 MG tablet, TAKE 1 TABLET (500 MG TOTAL) BY MOUTH DAILY AS NEEDED., Disp: 30 tablet, Rfl: 0 .  cyclobenzaprine (FLEXERIL) 10 MG tablet, TAKE 1 TABLET (10 MG TOTAL) BY MOUTH DAILY AS NEEDED FOR MUSCLE SPASMS. (Patient not taking: Reported on 05/08/2016), Disp: 30  tablet, Rfl: 0  Current Facility-Administered Medications:  .  fentaNYL (SUBLIMAZE) injection 100 mcg, 100 mcg, Intravenous, Once, Mohammed Kindle, MD .  lactated ringers infusion 1,000 mL, 1,000 mL, Intravenous, Continuous, Mohammed Kindle, MD .  midazolam (VERSED) 5 MG/5ML injection 5 mg, 5 mg, Intravenous, Once, Mohammed Kindle, MD .  orphenadrine (NORFLEX) injection 60 mg, 60 mg, Intramuscular, Once, Mohammed Kindle, MD .  triamcinolone acetonide (KENALOG-40) injection 40 mg, 40 mg, Other, Once, Mohammed Kindle, MD  No Known Allergies  ROS  Constitutional: Negative for fever or weight change.  Respiratory: Negative for cough and shortness of breath.   Cardiovascular: Negative for chest pain or palpitations.  Gastrointestinal: Negative for abdominal pain, no bowel changes.  GU: See HPI Musculoskeletal: Negative for gait problem or joint swelling.  Skin: Negative for rash.  Neurological: Negative for dizziness or headache.  No other specific complaints in a complete review of systems (except as listed in HPI above).  Objective  Vitals:   06/19/16 0829  BP: 122/70  Pulse: 74  Resp: 16  Temp: 97.9 F (36.6 C)  TempSrc: Oral  SpO2: 98%  Weight: 130 lb 3.2 oz (59.1 kg)  Height: 5' 5"  (1.651 m)    Body mass index is 21.67 kg/m.  Nursing Note and Vital Signs reviewed.  Physical Exam  Constitutional: Patient appears well-developed and well-nourished.  No distress.  HEENT:  head atraumatic, normocephalic Cardiovascular: Normal rate, regular rhythm, S1/S2 with slight murmur present (baseline for pt).  No murmur or rub heard. No BLE edema. Pulmonary/Chest: Effort normal and breath sounds clear. No respiratory distress or retractions. Abdominal: Soft and non-tender, bowel sounds present x4 quadrants. Psychiatric: Patient has a normal mood and affect. behavior is normal. Judgment and thought content normal. FEMALE GENITALIA:  External genitalia normal External urethra  normal Vaginal vault normal with small amount if light gray discharge, no lesions Cervix normal with small amount of light gray discharge, no lesions Bimanual exam normal without masses, no CMT   No results found for this or any previous visit (from the past 2160 hour(s)).  Assessment & Plan  1. Vaginal discharge - WET PREP BY MOLECULAR PROBE  2. Vaginal irritation - WET PREP BY MOLECULAR PROBE  - Discussed possibility of vaginal candidiasis vs BV and the typical treatments for both.  Discussed the antabuse-like effect of flagyl and she is agreeable to avoid ETOH use during treatment and for 7 days afterwards if this medication is needed. We will send in appropriate medications if warranted once wet prep results are returned.  -Red flags and when to present for emergency care or RTC including fever >101.79F, chest pain, shortness of breath, new/worsening/un-resolving symptoms, abdominal pain, nausea, vomiting reviewed with patient at time of visit. Follow up and care instructions discussed and provided in AVS.  I have reviewed this encounter including the documentation in this note and/or discussed this patient with the Johney Maine, FNP, NP-C. I am certifying that I agree with the content of this note as supervising physician.  Steele Sizer, MD [facility] 06/19/2016, 12:59 PM

## 2016-06-19 NOTE — Patient Instructions (Addendum)
Bacterial Vaginosis Bacterial vaginosis is an infection of the vagina. It happens when too many germs (bacteria) grow in the vagina. This infection puts you at risk for infections from sex (STIs). Treating this infection can lower your risk for some STIs. You should also treat this if you are pregnant. It can cause your baby to be born early. Follow these instructions at home: Medicines  Take over-the-counter and prescription medicines only as told by your doctor.  Take or use your antibiotic medicine as told by your doctor. Do not stop taking or using it even if you start to feel better. General instructions  If you your sexual partner is a woman, tell her that you have this infection. She needs to get treatment if she has symptoms. If you have a female partner, he does not need to be treated.  During treatment: ? Avoid sex. ? Do not douche. ? Avoid alcohol as told. ? Avoid breastfeeding as told.  Drink enough fluid to keep your pee (urine) clear or pale yellow.  Keep your vagina and butt (rectum) clean. ? Wash the area with warm water every day. ? Wipe from front to back after you use the toilet.  Keep all follow-up visits as told by your doctor. This is important. Preventing this condition  Do not douche.  Use only warm water to wash around your vagina.  Use protection when you have sex. This includes: ? Latex condoms. ? Dental dams.  Limit how many people you have sex with. It is best to only have sex with the same person (be monogamous).  Get tested for STIs. Have your partner get tested.  Wear underwear that is cotton or lined with cotton.  Avoid tight pants and pantyhose. This is most important in summer.  Do not use any products that have nicotine or tobacco in them. These include cigarettes and e-cigarettes. If you need help quitting, ask your doctor.  Do not use illegal drugs.  Limit how much alcohol you drink. Contact a doctor if:  Your symptoms do not get  better, even after you are treated.  You have more discharge or pain when you pee (urinate).  You have a fever.  You have pain in your belly (abdomen).  You have pain with sex.  Your bleed from your vagina between periods. Summary  This infection happens when too many germs (bacteria) grow in the vagina.  Treating this condition can lower your risk for some infections from sex (STIs).  You should also treat this if you are pregnant. It can cause early (premature) birth.  Do not stop taking or using your antibiotic medicine even if you start to feel better. This information is not intended to replace advice given to you by your health care provider. Make sure you discuss any questions you have with your health care provider. Document Released: 09/27/2007 Document Revised: 09/03/2015 Document Reviewed: 09/03/2015 Elsevier Interactive Patient Education  2017 [employer].

## 2016-06-20 LAB — WET PREP BY MOLECULAR PROBE
CANDIDA SPECIES: NOT DETECTED
Gardnerella vaginalis: NOT DETECTED
Trichomonas vaginosis: NOT DETECTED

## 2016-07-11 ENCOUNTER — Ambulatory Visit (INDEPENDENT_AMBULATORY_CARE_PROVIDER_SITE_OTHER): Payer: BC Managed Care – PPO

## 2016-07-11 ENCOUNTER — Other Ambulatory Visit: Payer: Self-pay

## 2016-07-11 DIAGNOSIS — R011 Cardiac murmur, unspecified: Secondary | ICD-10-CM

## 2016-07-17 ENCOUNTER — Ambulatory Visit (INDEPENDENT_AMBULATORY_CARE_PROVIDER_SITE_OTHER): Payer: BC Managed Care – PPO | Admitting: Family Medicine

## 2016-07-17 ENCOUNTER — Encounter: Payer: Self-pay | Admitting: Family Medicine

## 2016-07-17 VITALS — BP 110/70 | HR 82 | Temp 97.4°F | Resp 16 | Ht 65.0 in | Wt 131.4 lb

## 2016-07-17 DIAGNOSIS — N3000 Acute cystitis without hematuria: Secondary | ICD-10-CM | POA: Diagnosis not present

## 2016-07-17 LAB — POCT URINALYSIS DIPSTICK
Bilirubin, UA: NEGATIVE
Glucose, UA: NEGATIVE
Ketones, UA: NEGATIVE
NITRITE UA: NEGATIVE
Spec Grav, UA: 1.015 (ref 1.010–1.025)
UROBILINOGEN UA: 1 U/dL
pH, UA: 6 (ref 5.0–8.0)

## 2016-07-17 MED ORDER — CIPROFLOXACIN HCL 250 MG PO TABS: 250.0000 mg | ORAL_TABLET | Freq: Two times a day (BID) | ORAL | 0 refills | Status: DC

## 2016-07-17 NOTE — Progress Notes (Addendum)
Name: Kaitlyn Harvey   MRN: [id number]    DOB: [birth year]   Date:07/17/2016       Progress Note  Subjective  Chief Complaint  Chief Complaint  Patient presents with  . Urinary Tract Infection    pressure, frequency on and off    HPI  Pt presents with 6 days of increased urinary frequency and pressure. She denies NVD, flank/back pain, no history of kidney stones, no abdominal pain, fevers or chills, body aches.  Patient Active Problem List   Diagnosis Date Noted  . Murmur 05/09/2016  . Palpitations 05/09/2016  . Acute cystitis 01/31/2016  . Antibiotic-induced yeast infection 01/31/2016  . DDD (degenerative disc disease), lumbar 08/09/2015  . 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 Well woman exam without gynecological exam 02/28/2015  . Tobacco abuse 02/28/2015  . Screening for breast cancer 02/28/2015    Social History  Substance Use Topics  . Smoking status: Former Smoker    Packs/day: 0.50    Years: 20.00    Types: Cigarettes    Start date: 12/20/1994    Quit date: 06/09/2015  . Smokeless tobacco: Never Used  . Alcohol use 1.2 oz/week    2 Glasses of wine per week     Current Outpatient Prescriptions:  .  Biotin 10 MG CAPS, Take 1 capsule by mouth daily., Disp: , Rfl:  .  ibuprofen (ADVIL,MOTRIN) 600 MG tablet, Take 600 mg by mouth every 6 (six) hours as needed., Disp: , Rfl:  .  Multiple Vitamin (MULTIVITAMIN) tablet, Take 1 tablet by mouth daily., Disp: , Rfl:  .  naproxen (NAPROSYN) 500 MG tablet, TAKE 1 TABLET (500 MG TOTAL) BY MOUTH DAILY AS NEEDED., Disp: 30 tablet, Rfl: 0 .  cyclobenzaprine (FLEXERIL) 10 MG tablet, TAKE 1 TABLET (10 MG TOTAL) BY MOUTH DAILY AS NEEDED FOR MUSCLE SPASMS. (Patient not taking: Reported on 05/08/2016), Disp: 30 tablet, Rfl: 0  Current Facility-Administered Medications:  .  fentaNYL (SUBLIMAZE) injection 100 mcg, 100 mcg, Intravenous, Once, Mohammed Kindle, MD .  lactated ringers infusion  1,000 mL, 1,000 mL, Intravenous, Continuous, Mohammed Kindle, MD .  midazolam (VERSED) 5 MG/5ML injection 5 mg, 5 mg, Intravenous, Once, Mohammed Kindle, MD .  orphenadrine (NORFLEX) injection 60 mg, 60 mg, Intramuscular, Once, Mohammed Kindle, MD .  triamcinolone acetonide (KENALOG-40) injection 40 mg, 40 mg, Other, Once, Mohammed Kindle, MD  No Known Allergies  ROS  Constitutional: Negative for fever or weight change.  Respiratory: Negative for cough and shortness of breath.   Cardiovascular: Negative for chest pain or palpitations.  Gastrointestinal: Negative for abdominal pain, no bowel changes.  Musculoskeletal: Negative for gait problem or joint swelling.  Skin: Negative for rash.  GU: See HPI Neurological: Negative for dizziness or headache.  No other specific complaints in a complete review of systems (except as listed in HPI above).  Objective  Vitals:   07/17/16 0813  BP: 110/70  Pulse: 82  Resp: 16  Temp: (!) 97.4 F (36.3 C)  TempSrc: Oral  SpO2: 95%  Weight: 131 lb 6.4 oz (59.6 kg)  Height: 5' 5"  (1.651 m)   Body mass index is 21.87 kg/m.  Nursing Note and Vital Signs reviewed.  Physical Exam  Constitutional: Patient appears well-developed and well-nourished. Obese No distress.  HEENT: head atraumatic, normocephalic Cardiovascular: Normal rate, regular rhythm, S1/S2 present.  No murmur or rub heard. No BLE edema. Pulmonary/Chest: Effort normal and breath sounds clear. No respiratory distress or retractions. Abdominal:  Soft and non-tender, bowel sounds present x4 quadrants.  No CVA Tenderness Psychiatric: Patient has a normal mood and affect. behavior is normal. Judgment and thought content normal.  Recent Results (from the past 2160 hour(s))  WET PREP BY MOLECULAR PROBE     Status: None   Collection Time: 06/19/16  9:01 AM  Result Value Ref Range   Candida species NOT DETECTED NOT DETECTED   Trichomonas vaginosis NOT DETECTED NOT DETECTED   Gardnerella  vaginalis NOT DETECTED NOT DETECTED  POCT urinalysis dipstick     Status: Abnormal   Collection Time: 07/17/16  8:21 AM  Result Value Ref Range   Color, UA yellow    Clarity, UA clear    Glucose, UA negative    Bilirubin, UA negative    Ketones, UA negative    Spec Grav, UA 1.015 1.010 - 1.025   Blood, UA moderate    pH, UA 6.0 5.0 - 8.0   Protein, UA trace    Urobilinogen, UA 1.0 0.2 or 1.0 E.U./dL   Nitrite, UA negative    Leukocytes, UA Small (1+) (A) Negative     Assessment & Plan  1. Acute cystitis without hematuria - POCT urinalysis dipstick - ciprofloxacin (CIPRO) 250 MG tablet; Take 1 tablet (250 mg total) by mouth 2 (two) times daily.  Dispense: 6 tablet; Refill: 0 - Urine Culture  -Red flags and when to present for emergency care or RTC including fever >101.48F, chest pain, shortness of breath, new/worsening/un-resolving symptoms, flank/back pain, malaise/fatigue, abdominal pain reviewed with patient at time of visit. Follow up and care instructions discussed and provided in AVS.  I have reviewed this encounter including the documentation in this note and/or discussed this patient with the Johney Maine, FNP, NP-C. I am certifying that I agree with the content of this note as supervising physician.  Steele Sizer, MD [facility] 07/17/2016, 12:55 PM

## 2016-07-17 NOTE — Patient Instructions (Addendum)

## 2016-07-19 LAB — URINE CULTURE

## 2016-07-31 ENCOUNTER — Other Ambulatory Visit: Payer: Self-pay | Admitting: Family Medicine

## 2016-07-31 DIAGNOSIS — M5136 Other intervertebral disc degeneration, lumbar region: Secondary | ICD-10-CM

## 2016-08-29 ENCOUNTER — Other Ambulatory Visit: Payer: Self-pay | Admitting: Family Medicine

## 2016-08-29 DIAGNOSIS — M5136 Other intervertebral disc degeneration, lumbar region: Secondary | ICD-10-CM

## 2016-11-06 ENCOUNTER — Ambulatory Visit (INDEPENDENT_AMBULATORY_CARE_PROVIDER_SITE_OTHER): Payer: BC Managed Care – PPO

## 2016-11-06 DIAGNOSIS — Z23 Encounter for immunization: Secondary | ICD-10-CM | POA: Diagnosis not present

## 2017-03-04 ENCOUNTER — Ambulatory Visit: Payer: BC Managed Care – PPO | Admitting: Family Medicine

## 2017-03-04 ENCOUNTER — Encounter: Payer: Self-pay | Admitting: Family Medicine

## 2017-03-04 VITALS — BP 110/80 | HR 70 | Temp 98.0°F | Resp 16 | Ht 65.0 in | Wt 138.2 lb

## 2017-03-04 DIAGNOSIS — J209 Acute bronchitis, unspecified: Secondary | ICD-10-CM | POA: Diagnosis not present

## 2017-03-04 DIAGNOSIS — H1032 Unspecified acute conjunctivitis, left eye: Secondary | ICD-10-CM

## 2017-03-04 DIAGNOSIS — B373 Candidiasis of vulva and vagina: Secondary | ICD-10-CM | POA: Diagnosis not present

## 2017-03-04 DIAGNOSIS — R05 Cough: Secondary | ICD-10-CM | POA: Diagnosis not present

## 2017-03-04 DIAGNOSIS — R059 Cough, unspecified: Secondary | ICD-10-CM

## 2017-03-04 DIAGNOSIS — B3731 Acute candidiasis of vulva and vagina: Secondary | ICD-10-CM

## 2017-03-04 MED ORDER — ERYTHROMYCIN 5 MG/GM OP OINT: 1.0000 "application " | TOPICAL_OINTMENT | Freq: Four times a day (QID) | OPHTHALMIC | 0 refills | Status: DC

## 2017-03-04 MED ORDER — HYDROCOD POLST-CPM POLST ER 10-8 MG/5ML PO SUER: 5.0000 mL | Freq: Every evening | ORAL | 0 refills | Status: DC | PRN

## 2017-03-04 MED ORDER — FLUCONAZOLE 150 MG PO TABS: 150.0000 mg | ORAL_TABLET | Freq: Once | ORAL | 1 refills | Status: AC

## 2017-03-04 MED ORDER — BENZONATATE 100 MG PO CAPS: 100.0000 mg | ORAL_CAPSULE | Freq: Two times a day (BID) | ORAL | 0 refills | Status: DC | PRN

## 2017-03-04 MED ORDER — AZITHROMYCIN 250 MG PO TABS: ORAL_TABLET | ORAL | 0 refills | Status: DC

## 2017-03-04 MED ORDER — PREDNISONE 10 MG PO TABS: ORAL_TABLET | ORAL | 0 refills | Status: DC

## 2017-03-04 NOTE — Patient Instructions (Addendum)
Acute Bronchitis, Adult Acute bronchitis is when air tubes (bronchi) in the lungs suddenly get swollen. The condition can make it hard to breathe. It can also cause these symptoms:  A cough.  Coughing up clear, yellow, or green mucus.  Wheezing.  Chest congestion.  Shortness of breath.  A fever.  Body aches.  Chills.  A sore throat.  Follow these instructions at home: Medicines  Take over-the-counter and prescription medicines only as told by your doctor.  If you were prescribed an antibiotic medicine, take it as told by your doctor. Do not stop taking the antibiotic even if you start to feel better. General instructions  Rest.  Drink enough fluids to keep your pee (urine) clear or pale yellow.  Avoid smoking and secondhand smoke. If you smoke and you need help quitting, ask your doctor. Quitting will help your lungs heal faster.  Use an inhaler, cool mist vaporizer, or humidifier as told by your doctor.  Keep all follow-up visits as told by your doctor. This is important. How is this prevented? To lower your risk of getting this condition again:  Wash your hands often with soap and water. If you cannot use soap and water, use hand sanitizer.  Avoid contact with people who have cold symptoms.  Try not to touch your hands to your mouth, nose, or eyes.  Make sure to get the flu shot every year.  Contact a doctor if:  Your symptoms do not get better in 2 weeks. Get help right away if:  You cough up blood.  You have chest pain.  You have very bad shortness of breath.  You become dehydrated.  You faint (pass out) or keep feeling like you are going to pass out.  You keep throwing up (vomiting).  You have a very bad headache.  Your fever or chills gets worse. This information is not intended to replace advice given to you by your health care provider. Make sure you discuss any questions you have with your health care provider. Document Released:  06/06/2007 Document Revised: 07/27/2015 Document Reviewed: 06/08/2015 Elsevier Interactive Patient Education  2018 [employer].  Bacterial Conjunctivitis Bacterial conjunctivitis is an infection of your conjunctiva. This is the clear membrane that covers the white part of your eye and the inner surface of your eyelid. This condition can make your eye:  Red or pink.  Itchy.  This condition is caused by bacteria. This condition spreads very easily from person to person (is contagious) and from one eye to the other eye. Follow these instructions at home: Medicines  Take or apply your antibiotic medicine as told by your doctor. Do not stop taking or applying the antibiotic even if you start to feel better.  Take or apply over-the-counter and prescription medicines only as told by your doctor.  Do not touch your eyelid with the eye drop bottle or the ointment tube. Managing discomfort  Wipe any fluid from your eye with a warm, wet washcloth or a cotton ball.  Place a cool, clean washcloth on your eye. Do this for 10-20 minutes, 3-4 times per day. General instructions  Do not wear contact lenses until the irritation is gone. Wear glasses until your doctor says it is okay to wear contacts.  Do not wear eye makeup until your symptoms are gone. Throw away any old makeup.  Change or wash your pillowcase every day.  Do not share towels or washcloths with anyone.  Wash your hands often with soap and water. Use  paper towels to dry your hands.  Do not touch or rub your eyes.  Do not drive or use heavy machinery if your vision is blurry. Contact a doctor if:  You have a fever.  Your symptoms do not get better after 10 days. Get help right away if:  You have a fever and your symptoms suddenly get worse.  You have very bad pain when you move your eye.  Your face: ? Hurts. ? Is red. ? Is swollen.  You have sudden loss of vision. This information is not intended to replace  advice given to you by your health care provider. Make sure you discuss any questions you have with your health care provider. Document Released: 09/27/2007 Document Revised: 05/26/2015 Document Reviewed: 09/30/2014 Elsevier Interactive Patient Education  [employer].

## 2017-03-04 NOTE — Progress Notes (Signed)
Name: Kaitlyn Harvey   MRN: [id number]    DOB: 03/03/1975   Date:03/04/2017       Progress Note  Subjective  Chief Complaint  Chief Complaint  Patient presents with  . URI    cough, congested, for 1 week  . Eye Problem    red, swollen since last night    HPI  Respiratory Illness: Pt presents with productive cough and congestion for over a week - had a few days where she started feeling better, but the last 2-3 days have gotten worse. Some nasal congestion, some sinus pressure, shortness of breath with exertion, some chest discomfort at night when coughing only.  Denies wheezing, ear pain/pressure, body aches. She has tried robitussin chest congestion.  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  LEFT eye: woke up this morning with LEFT conjunctiva erythema and yellow drainage with lash mattering.  Denies vision changes or headache.  Patient Active Problem List   Diagnosis Date Noted  . Murmur 05/09/2016  . Palpitations 05/09/2016  . Acute cystitis 01/31/2016  . Antibiotic-induced yeast infection 01/31/2016  . DDD (degenerative disc disease), lumbar 08/09/2015  . Facet syndrome, lumbar 08/09/2015  . 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 Well woman exam without gynecological exam 02/28/2015  . Tobacco abuse 02/28/2015  . Screening for breast cancer 02/28/2015    Social History   Tobacco Use  . Smoking status: Former Smoker    Packs/day: 0.50    Years: 20.00    Pack years: 10.00    Types: Cigarettes    Start date: 12/20/1994    Last attempt to quit: 06/09/2015    Years since quitting: 1.7  . Smokeless tobacco: Never Used  Substance Use Topics  . Alcohol use: Yes    Alcohol/week: 1.2 oz    Types: 2 Glasses of wine per week     Current Outpatient Medications:  .  ibuprofen (ADVIL,MOTRIN) 600 MG tablet, Take 600 mg by mouth every 6 (six) hours as needed.,  Disp: , Rfl:  .  Multiple Vitamin (MULTIVITAMIN) tablet, Take 1 tablet by mouth daily., Disp: , Rfl:  .  Biotin 10 MG CAPS, Take 1 capsule by mouth daily., Disp: , Rfl:  .  ciprofloxacin (CIPRO) 250 MG tablet, Take 1 tablet (250 mg total) by mouth 2 (two) times daily. (Patient not taking: Reported on 03/04/2017), Disp: 6 tablet, Rfl: 0 .  cyclobenzaprine (FLEXERIL) 10 MG tablet, TAKE 1 TABLET (10 MG TOTAL) BY MOUTH DAILY AS NEEDED FOR MUSCLE SPASMS. (Patient not taking: Reported on 05/08/2016), Disp: 30 tablet, Rfl: 0 .  naproxen (NAPROSYN) 500 MG tablet, TAKE 1 TABLET (500 MG TOTAL) BY MOUTH DAILY AS NEEDED. (Patient not taking: Reported on 03/04/2017), Disp: 30 tablet, Rfl: 0  Current Facility-Administered Medications:  .  fentaNYL (SUBLIMAZE) injection 100 mcg, 100 mcg, Intravenous, Once, Mohammed Kindle, MD .  lactated ringers infusion 1,000 mL, 1,000 mL, Intravenous, Continuous, Mohammed Kindle, MD .  midazolam (VERSED) 5 MG/5ML injection 5 mg, 5 mg, Intravenous, Once, Mohammed Kindle, MD .  orphenadrine (NORFLEX) injection 60 mg, 60 mg, Intramuscular, Once, Mohammed Kindle, MD .  triamcinolone acetonide (KENALOG-40) injection 40 mg, 40 mg, Other, Once, Mohammed Kindle, MD  No Known Allergies  ROS  Constitutional: Negative for fever or weight change.  Respiratory: See HPI Cardiovascular: See HPI; negative for palpitations Gastrointestinal: Negative for abdominal pain, no bowel changes.  Musculoskeletal: Negative for gait problem or joint swelling.  Skin: Negative for rash.  Neurological: Negative for dizziness or headache.  No other specific complaints in a complete review of systems (except as listed in HPI above).  Objective  Vitals:   03/04/17 1037  BP: 110/80  Pulse: 70  Resp: 16  Temp: 98 F (36.7 C)  TempSrc: Oral  SpO2: 98%  Weight: 138 lb 3.2 oz (62.7 kg)  Height: 5' 5"  (1.651 m)   Body mass index is 23 kg/m.  Nursing Note and Vital Signs reviewed.  Physical  Exam Constitutional: Patient appears well-developed and well-nourished.  No distress.  HEENT: head atraumatic, normocephalic, pupils equal and reactive to light, EOM's intact, TM's without erythema or bulging, no maxillary or frontal sinus tenderness , neck supple without lymphadenopathy, oropharynx pink and moist without exudate Cardiovascular: Normal rate, regular rhythm, S1/S2 present.  No murmur or rub heard. No BLE edema. Pulmonary/Chest: Effort normal and breath sounds with mild expiratory wheezing in BUL; dry raspy cough throughout examination. No respiratory distress or retractions. Psychiatric: Patient has a normal mood and affect. behavior is normal. Judgment and thought content normal.  No results found for this or any previous visit (from the past 72 hour(s)).  Assessment & Plan  1. Acute bronchitis, unspecified organism - azithromycin (ZITHROMAX) 250 MG tablet; Day1: Take 2 tabs; Days2-5:Take 1 tab daily  Dispense: 6 tablet; Refill: 0 - predniSONE (DELTASONE) 10 MG tablet; Day1:5tabs, Day2:4tabs, Day3:3tabs, Day4:2tabs, Day5:1tab  Dispense: 15 tablet; Refill: 0  2. Cough - chlorpheniramine-HYDROcodone (TUSSIONEX PENNKINETIC ER) 10-8 MG/5ML SUER; Take 5 mLs by mouth at bedtime as needed for cough.  Dispense: 140 mL; Refill: 0 - benzonatate (TESSALON) 100 MG capsule; Take 1 capsule (100 mg total) by mouth 2 (two) times daily as needed for cough.  Dispense: 30 capsule; Refill: 0  3. Acute bacterial conjunctivitis of left eye - erythromycin ophthalmic ointment; Place 1 application into the left eye 4 (four) times daily.  Dispense: 3.5 g; Refill: 0  4. Vaginal candidiasis - fluconazole (DIFLUCAN) 150 MG tablet; Take 1 tablet (150 mg total) by mouth once for 1 dose. May repeat in 72 hours if not improving.  Dispense: 1 tablet; Refill: 1  - Work note for today and tomorrow is provided - may return 03/06/17 -Red flags and when to present for emergency care or RTC including fever >101.70F,  chest pain, shortness of breath, new/worsening/un-resolving symptoms, periorbital swelling, pain with ocular swelling, reviewed with patient at time of visit. Follow up and care instructions discussed and provided in AVS.

## 2017-03-05 ENCOUNTER — Encounter: Payer: Self-pay | Admitting: Emergency Medicine

## 2017-03-05 ENCOUNTER — Ambulatory Visit: Payer: Self-pay

## 2017-03-05 NOTE — Telephone Encounter (Signed)
Pt. called to ask how long she is contagious with pink eye.  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  Advised that it should be safe to rtn. to work 24 hrs. after eye drops have started, unless she is continuing to have a lot of mattering from the eyes, then she may need to take an extra day.  Stated her symptoms are improving, but she still has drainage from the eyes.  Requested a work note to be able to take off one more day for recuperation, with pinkeye and bronchitis.    Advised will send note to the provider, and request a work note to excuse her from 3/4-03/06/17. Verb. Understanding; agreed w/ plan.

## 2017-03-05 NOTE — Telephone Encounter (Signed)
Letter written, Patient notified to pick up letter at front desk

## 2017-03-05 NOTE — Telephone Encounter (Signed)
Please advise 

## 2017-03-05 NOTE — Telephone Encounter (Signed)
Please provide note for today and tomorrow so that her drainage has time to improve prior to returning to work.  Advise that she needs to return if she is not improving by Thursday. Thanks!

## 2017-03-06 ENCOUNTER — Other Ambulatory Visit: Payer: Self-pay

## 2017-03-06 ENCOUNTER — Other Ambulatory Visit: Payer: Self-pay | Admitting: Family Medicine

## 2017-03-06 DIAGNOSIS — R05 Cough: Secondary | ICD-10-CM

## 2017-03-06 DIAGNOSIS — R059 Cough, unspecified: Secondary | ICD-10-CM

## 2017-03-06 NOTE — Telephone Encounter (Signed)
Copied from [place] [id number]. Topic: Quick Communication - Rx Refill/Question >> Mar 06, 2017  8:19 AM Synthia Innocent wrote: Medication: chlorpheniramine-HYDROcodone ([place] [facility]) 10-8 MG/5ML SUER   Has the patient contacted their pharmacy? Yes.     (Agent: If no, request that the patient contact the pharmacy for the refill.)   Preferred Pharmacy (with phone number or street name):[facility] [address]   Pearline Cables: Please be advised that RX refills may take up to 3 business days. We ask that you follow-up with your pharmacy.  Not completely out, but will be soon. Still has cough. Please advise

## 2017-03-07 NOTE — Telephone Encounter (Signed)
1 antibiotic left. Her cough is really bad and she want something that will last her thru the weekend

## 2017-03-07 NOTE — Telephone Encounter (Signed)
Please call patient She received a prescription for Tussionex on March 4th from [facility] and appears now to be requesting another prescription for the same (request came in on March 6th) CALL PATIENT, verify how she is taking that med She should NOT be out This can NOT be refilled Taking too much can cause a FATAL OVERDOSE I am absolutely declining the refill request If she has a problem with narcotics and needs help, we are here for her, so come in for an appointment or go to [facility]

## 2017-03-07 NOTE — Telephone Encounter (Signed)
Documentation reviewed and am in agreement.

## 2017-03-07 NOTE — Telephone Encounter (Signed)
PT called to check on refill  status of the chlorpheniramine-HYDROcodone (TUSSIONEX PENNKINETIC ER) 10-8 MG/5ML SUER  Informed her it can not be refilled at this time, she is now requesting refill on zpac and states she still has cough.  [id number](Phone) [phone](Fax)

## 2017-03-07 NOTE — Telephone Encounter (Signed)
This patient was seen 3 days ago - there is no reason that she should be out of her tussionex, benzonatate, or her azithromycin - no refills will be provided.    - We do not refill controlled substances or antibiotics without seeing patients in the office.   - She should still have 2 days left of prednisone and azithromycin, she needs to give these medications time to work.  -Please call patient and verify how she is taking the tussionex - she was prescribed 12m of tussionex to take [id number] - if she is already out this medication this means that she has taken [id number] for the last 3 days, and we need to discuss her improper usage of this medication.

## 2017-03-07 NOTE — Telephone Encounter (Signed)
She should have plenty of tessalon for daytime and tussionex for nighttime to get her through the weekend and into next week without running out if she is taking as prescribed. No additional refills. If she feels that her cough is uncontrolled, she is having chest pain, shortness of breath, or other concerning symptoms, she needs to present for urgent or emergent care.

## 2017-03-08 ENCOUNTER — Encounter: Payer: BC Managed Care – PPO | Admitting: Family Medicine

## 2017-03-14 NOTE — Telephone Encounter (Signed)
Patient notified

## 2017-04-09 ENCOUNTER — Encounter: Payer: Self-pay | Admitting: Family Medicine

## 2017-04-09 ENCOUNTER — Ambulatory Visit (INDEPENDENT_AMBULATORY_CARE_PROVIDER_SITE_OTHER): Payer: BC Managed Care – PPO | Admitting: Family Medicine

## 2017-04-09 VITALS — BP 112/78 | HR 83 | Temp 98.2°F | Resp 14 | Ht 64.75 in | Wt 132.6 lb

## 2017-04-09 DIAGNOSIS — Z0000 Encounter for general adult medical examination without abnormal findings: Secondary | ICD-10-CM | POA: Diagnosis not present

## 2017-04-09 DIAGNOSIS — Z1239 Encounter for other screening for malignant neoplasm of breast: Secondary | ICD-10-CM

## 2017-04-09 DIAGNOSIS — M5136 Other intervertebral disc degeneration, lumbar region: Secondary | ICD-10-CM | POA: Diagnosis not present

## 2017-04-09 DIAGNOSIS — M47816 Spondylosis without myelopathy or radiculopathy, lumbar region: Secondary | ICD-10-CM | POA: Diagnosis not present

## 2017-04-09 DIAGNOSIS — Z23 Encounter for immunization: Secondary | ICD-10-CM

## 2017-04-09 DIAGNOSIS — I83812 Varicose veins of left lower extremities with pain: Secondary | ICD-10-CM | POA: Diagnosis not present

## 2017-04-09 DIAGNOSIS — Z114 Encounter for screening for human immunodeficiency virus [HIV]: Secondary | ICD-10-CM | POA: Diagnosis not present

## 2017-04-09 DIAGNOSIS — Z1231 Encounter for screening mammogram for malignant neoplasm of breast: Secondary | ICD-10-CM | POA: Diagnosis not present

## 2017-04-09 NOTE — Patient Instructions (Addendum)
Try horse chestnut supplement for vein health We'll have you see the vascular specialist about your veins Try compression stockings for now We'll get you a functional capacity evaluation with physical therapy  You received the vaccine to protect against tetanus and diphtheria and pertussis today; the tetanus and diphtheria portions will provide protection up to ten years, and the pertussis component will give you protection against whooping cough for life  We'll get labs today If you have not heard anything from my staff in a week about any orders/referrals/studies from today, please contact us here to follow-up [phone] Health Maintenance, Female Adopting a healthy lifestyle and getting preventive care can go a long way to promote health and wellness. Talk with your health care provider about what schedule of regular examinations is right for you. This is a good chance for you to check in with your provider about disease prevention and staying healthy. In between checkups, there are plenty of things you can do on your own. Experts have done a lot of research about which lifestyle changes and preventive measures are most likely to keep you healthy. Ask your health care provider for more information. Weight and diet Eat a healthy diet  Be sure to include plenty of vegetables, fruits, low-fat dairy products, and lean protein.  Do not eat a lot of foods high in solid fats, added sugars, or salt.  Get regular exercise. This is one of the most important things you can do for your health. ? Most adults should exercise for at least 150 minutes each week. The exercise should increase your heart rate and make you sweat (moderate-intensity exercise). ? Most adults should also do strengthening exercises at least twice a week. This is in addition to the moderate-intensity exercise.  Maintain a healthy weight  Body mass index (BMI) is a measurement that can be used to identify possible weight  problems. It estimates body fat based on height and weight. Your health care provider can help determine your BMI and help you achieve or maintain a healthy weight.  For females 47 years of age and older: ? A BMI below 18.5 is considered underweight. ? A BMI of 18.5 to 24.9 is normal. ? A BMI of 25 to 29.9 is considered overweight. ? A BMI of 30 and above is considered obese.  Watch levels of cholesterol and blood lipids  You should start having your blood tested for lipids and cholesterol at 44 years of age, then have this test every 5 years.  You may need to have your cholesterol levels checked more often if: ? Your lipid or cholesterol levels are high. ? You are older than 44 years of age. ? You are at high risk for heart disease.  Cancer screening Lung Cancer  Lung cancer screening is recommended for adults 14-27 years old who are at high risk for lung cancer because of a history of smoking.  A yearly low-dose CT scan of the lungs is recommended for people who: ? Currently smoke. ? Have quit within the past 15 years. ? Have at least a 30-pack-year history of smoking. A pack year is smoking an average of one pack of cigarettes a day for 1 year.  Yearly screening should continue until it has been 15 years since you quit.  Yearly screening should stop if you develop a health problem that would prevent you from having lung cancer treatment.  Breast Cancer  Practice breast self-awareness. This means understanding how your breasts normally appear and  feel.  It also means doing regular breast self-exams. Let your health care provider know about any changes, no matter how small.  If you are in your 20s or 30s, you should have a clinical breast exam (CBE) by a health care provider every 1-3 years as part of a regular health exam.  If you are 50 or older, have a CBE every year. Also consider having a breast X-ray (mammogram) every year.  If you have a family history of breast  cancer, talk to your health care provider about genetic screening.  If you are at high risk for breast cancer, talk to your health care provider about having an MRI and a mammogram every year.  Breast cancer gene (BRCA) assessment is recommended for women who have family members with BRCA-related cancers. BRCA-related cancers include: ? Breast. ? Ovarian. ? Tubal. ? Peritoneal cancers.  Results of the assessment will determine the need for genetic counseling and BRCA1 and BRCA2 testing.  Cervical Cancer Your health care provider may recommend that you be screened regularly for cancer of the pelvic organs (ovaries, uterus, and vagina). This screening involves a pelvic examination, including checking for microscopic changes to the surface of your cervix (Pap test). You may be encouraged to have this screening done every 3 years, beginning at age 92.  For women ages 43-65, health care providers may recommend pelvic exams and Pap testing every 3 years, or they may recommend the Pap and pelvic exam, combined with testing for human papilloma virus (HPV), every 5 years. Some types of HPV increase your risk of cervical cancer. Testing for HPV may also be done on women of any age with unclear Pap test results.  Other health care providers may not recommend any screening for nonpregnant women who are considered low risk for pelvic cancer and who do not have symptoms. Ask your health care provider if a screening pelvic exam is right for you.  If you have had past treatment for cervical cancer or a condition that could lead to cancer, you need Pap tests and screening for cancer for at least 20 years after your treatment. If Pap tests have been discontinued, your risk factors (such as having a new sexual partner) need to be reassessed to determine if screening should resume. Some women have medical problems that increase the chance of getting cervical cancer. In these cases, your health care provider may  recommend more frequent screening and Pap tests.  Colorectal Cancer  This type of cancer can be detected and often prevented.  Routine colorectal cancer screening usually begins at 44 years of age and continues through 44 years of age.  Your health care provider may recommend screening at an earlier age if you have risk factors for colon cancer.  Your health care provider may also recommend using home test kits to check for hidden blood in the stool.  A small camera at the end of a tube can be used to examine your colon directly (sigmoidoscopy or colonoscopy). This is done to check for the earliest forms of colorectal cancer.  Routine screening usually begins at age 29.  Direct examination of the colon should be repeated every 5-10 years through 44 years of age. However, you may need to be screened more often if early forms of precancerous polyps or small growths are found.  Skin Cancer  Check your skin from head to toe regularly.  Tell your health care provider about any new moles or changes in moles, especially if there  is a change in a mole's shape or color.  Also tell your health care provider if you have a mole that is larger than the size of a pencil eraser.  Always use sunscreen. Apply sunscreen liberally and repeatedly throughout the day.  Protect yourself by wearing long sleeves, pants, a wide-brimmed hat, and sunglasses whenever you are outside.  Heart disease, diabetes, and high blood pressure  High blood pressure causes heart disease and increases the risk of stroke. High blood pressure is more likely to develop in: ? People who have blood pressure in the high end of the normal range (130-139/85-89 mm Hg). ? People who are overweight or obese. ? People who are African American.  If you are 56-26 years of age, have your blood pressure checked every 3-5 years. If you are 45 years of age or older, have your blood pressure checked every year. You should have your blood  pressure measured twice-once when you are at a hospital or clinic, and once when you are not at a hospital or clinic. Record the average of the two measurements. To check your blood pressure when you are not at a hospital or clinic, you can use: ? An automated blood pressure machine at a pharmacy. ? A home blood pressure monitor.  If you are between 28 years and 40 years old, ask your health care provider if you should take aspirin to prevent strokes.  Have regular diabetes screenings. This involves taking a blood sample to check your fasting blood sugar level. ? If you are at a normal weight and have a low risk for diabetes, have this test once every three years after 44 years of age. ? If you are overweight and have a high risk for diabetes, consider being tested at a younger age or more often. Preventing infection Hepatitis B  If you have a higher risk for hepatitis B, you should be screened for this virus. You are considered at high risk for hepatitis B if: ? You were born in a country where hepatitis B is common. Ask your health care provider which countries are considered high risk. ? Your parents were born in a high-risk country, and you have not been immunized against hepatitis B (hepatitis B vaccine). ? You have HIV or AIDS. ? You use needles to inject street drugs. ? You live with someone who has hepatitis B. ? You have had sex with someone who has hepatitis B. ? You get hemodialysis treatment. ? You take certain medicines for conditions, including cancer, organ transplantation, and autoimmune conditions.  Hepatitis C  Blood testing is recommended for: ? Everyone born from [birth year] through 1967. ? Anyone with known risk factors for hepatitis C.  Sexually transmitted infections (STIs)  You should be screened for sexually transmitted infections (STIs) including gonorrhea and chlamydia if: ? You are sexually active and are younger than 44 years of age. ? You are older than 44 years  of age and your health care provider tells you that you are at risk for this type of infection. ? Your sexual activity has changed since you were last screened and you are at an increased risk for chlamydia or gonorrhea. Ask your health care provider if you are at risk.  If you do not have HIV, but are at risk, it may be recommended that you take a prescription medicine daily to prevent HIV infection. This is called pre-exposure prophylaxis (PrEP). You are considered at risk if: ? You are sexually active and do  not regularly use condoms or know the HIV status of your partner(s). ? You take drugs by injection. ? You are sexually active with a partner who has HIV.  Talk with your health care provider about whether you are at high risk of being infected with HIV. If you choose to begin PrEP, you should first be tested for HIV. You should then be tested every 3 months for as long as you are taking PrEP. Pregnancy  If you are premenopausal and you may become pregnant, ask your health care provider about preconception counseling.  If you may become pregnant, take 400 to 800 micrograms (mcg) of folic acid every day.  If you want to prevent pregnancy, talk to your health care provider about birth control (contraception). Osteoporosis and menopause  Osteoporosis is a disease in which the bones lose minerals and strength with aging. This can result in serious bone fractures. Your risk for osteoporosis can be identified using a bone density scan.  If you are 21 years of age or older, or if you are at risk for osteoporosis and fractures, ask your health care provider if you should be screened.  Ask your health care provider whether you should take a calcium or vitamin D supplement to lower your risk for osteoporosis.  Menopause may have certain physical symptoms and risks.  Hormone replacement therapy may reduce some of these symptoms and risks. Talk to your health care provider about whether hormone  replacement therapy is right for you. Follow these instructions at home:  Schedule regular health, dental, and eye exams.  Stay current with your immunizations.  Do not use any tobacco products including cigarettes, chewing tobacco, or electronic cigarettes.  If you are pregnant, do not drink alcohol.  If you are breastfeeding, limit how much and how often you drink alcohol.  Limit alcohol intake to no more than 1 drink per day for nonpregnant women. One drink equals 12 ounces of beer, 5 ounces of wine, or 1 ounces of hard liquor.  Do not use street drugs.  Do not share needles.  Ask your health care provider for help if you need support or information about quitting drugs.  Tell your health care provider if you often feel depressed.  Tell your health care provider if you have ever been abused or do not feel safe at home. This information is not intended to replace advice given to you by your health care provider. Make sure you discuss any questions you have with your health care provider. Document Released: 07/03/2010 Document Revised: 05/26/2015 Document Reviewed: 09/21/2014 Elsevier Interactive Patient Education  [employer].

## 2017-04-09 NOTE — Assessment & Plan Note (Signed)
USPSTF grade A and B recommendations reviewed with patient; age-appropriate recommendations, preventive care, screening tests, etc discussed and encouraged; healthy living encouraged; see AVS for patient education given to patient

## 2017-04-09 NOTE — Assessment & Plan Note (Signed)
Ongoing chronic problem; using OTC medicine

## 2017-04-09 NOTE — Progress Notes (Signed)
Patient ID: Kaitlyn Harvey, female   DOB: 06-07-1975, 42 y.o.   MRN: [id number]   Subjective:   Kaitlyn Harvey is a 42 y.o. 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; lower back pain; takes tylenol and ibuprofen; has been on her feet on her life; dilated veins in the left leg Her mother has dilatation of ascending aorta She has a heart murmur; saw cardiologist last year; had echocardiogram; mild MR one year f/u, due in July 2019  USPSTF grade A and B recommendations Depression:  Depression screen [facility] 2/9 04/09/2017 03/04/2017 02/27/2016 01/31/2016 09/07/2015  Decreased Interest 0 0 0 0 0  Down, Depressed, Hopeless 0 0 0 0 0  PHQ - 2 Score 0 0 0 0 0   Hypertension: BP Readings from Last 3 Encounters:  04/09/17 112/78  03/04/17 110/80  07/17/16 110/70   Obesity: Wt Readings from Last 3 Encounters:  04/09/17 132 lb 9.6 oz (60.1 kg)  03/04/17 138 lb 3.2 oz (62.7 kg)  07/17/16 131 lb 6.4 oz (59.6 kg)   BMI Readings from Last 3 Encounters:  04/09/17 22.24 kg/m  03/04/17 23.00 kg/m  07/17/16 21.87 kg/m    Skin cancer: no worrisome moles Lung cancer:  Quit several years ago Breast cancer: mammogram Colorectal cancer: no fam hx; start at age 25 Cervical cancer screening: done 02/28/2015, next Feb 2020 BRCA gene screening: family hx of breast and/or ovarian cancer and/or metastatic prostate cancer?  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; in her early 96s; treated with interferon STD testing and prevention (chl/gon/syphilis): not needed Intimate partner violence: no abuse Contraception: s/p vasc Osteoporosis: no steroids as a child Fall  prevention/vitamin D: discussed Immunizations: tetanus today Diet: good eater Exercise: busy at work, bike rides, walks Alcohol: little bit of wine or vodka tonic Tobacco use: quit years ago AAA: mother has dilatation, patient seeing cardiologist Aspirin: not taking, not needed Glucose: check today Glucose  Date Value Ref Range Status  03/08/2015 80 65 - 99 mg/dL Final   Glucose, Bld  Date Value Ref Range Status  04/09/2017 83 65 - 139 mg/dL Final    Comment:    .        Non-fasting reference interval .   03/05/2016 83 65 - 99 mg/dL Final   Lipids: check today Lab Results  Component Value Date   CHOL 178 04/09/2017   CHOL 201 (H) 03/05/2016   CHOL 169 03/08/2015   Lab Results  Component Value Date   HDL 105 04/09/2017   HDL 123 03/05/2016   HDL 112 03/08/2015   Lab Results  Component Value Date   LDLCALC 54 04/09/2017   LDLCALC 70 03/05/2016   LDLCALC 42 03/08/2015   Lab Results  Component Value Date   TRIG 105 04/09/2017   TRIG 41 03/05/2016   TRIG 77 03/08/2015   Lab Results  Component Value Date   CHOLHDL 1.7 04/09/2017   CHOLHDL 1.6 03/05/2016   CHOLHDL 1.5 03/08/2015   No results found for: LDLDIRECT   Past Medical History:  Diagnosis Date  . Arthritis    chronic back pain  Past Surgical History:  Procedure Laterality Date  . UPPER GASTROINTESTINAL ENDOSCOPY    . UPPER GI ENDOSCOPY     Family History  Problem Relation Age of Onset  . Asthma Mother   . Hypertension Mother   . Thyroid disease Mother   . Other Mother        dilatation of ascending aorta  . Heart attack Father 13  . Hypertension Brother   . Bell's palsy Brother   . Heart murmur Brother    Social History   Tobacco Use  . Smoking status: Former Smoker    Packs/day: 0.50    Years: 20.00    Pack years: 10.00    Types: Cigarettes    Start date: 12/20/1994    Last attempt to quit: 06/09/2015    Years since quitting: 1.8  . Smokeless tobacco: Never Used  Substance  Use Topics  . Alcohol use: Yes    Alcohol/week: 1.2 oz    Types: 2 Glasses of wine per week  . Drug use: No   Review of Systems  Objective:   Vitals:   04/09/17 1430  BP: 112/78  Pulse: 83  Resp: 14  Temp: 98.2 F (36.8 C)  TempSrc: Oral  SpO2: 98%  Weight: 132 lb 9.6 oz (60.1 kg)  Height: 5' 4.75" (1.645 m)   Body mass index is 22.24 kg/m. Wt Readings from Last 3 Encounters:  04/09/17 132 lb 9.6 oz (60.1 kg)  03/04/17 138 lb 3.2 oz (62.7 kg)  07/17/16 131 lb 6.4 oz (59.6 kg)   Physical Exam  Constitutional: She appears well-developed and well-nourished.  HENT:  Head: Normocephalic and atraumatic.  Right Ear: Hearing, tympanic membrane, external ear and ear canal normal.  Left Ear: Hearing, tympanic membrane, external ear and ear canal normal.  Eyes: Conjunctivae and EOM are normal. Right eye exhibits no hordeolum. Left eye exhibits no hordeolum. No scleral icterus.  Neck: Carotid bruit is not present. No thyromegaly present.  Cardiovascular: Normal rate, regular rhythm, S1 normal, S2 normal and normal heart sounds.  No extrasystoles are present.  Pulmonary/Chest: Effort normal and breath sounds normal. No respiratory distress.  Abdominal: Soft. Normal appearance and bowel sounds are normal. She exhibits no distension, no abdominal bruit, no pulsatile midline mass and no mass. There is no hepatosplenomegaly. There is no tenderness. No hernia.  Musculoskeletal: Normal range of motion. She exhibits no edema.  Lymphadenopathy:       Head (right side): No submandibular adenopathy present.       Head (left side): No submandibular adenopathy present.    She has no cervical adenopathy.    She has no axillary adenopathy.  Neurological: She is alert. She displays no tremor. No cranial nerve deficit. She exhibits normal muscle tone. Gait normal.  Reflex Scores:      Patellar reflexes are 2+ on the right side and 2+ on the left side. Skin: Skin is warm and dry. No bruising and  no ecchymosis noted. No cyanosis. No pallor.  Psychiatric: Her speech is normal and behavior is normal. Thought content normal. Her mood appears not anxious. She does not exhibit a depressed mood.    Assessment/Plan:   Problem List Items Addressed This Visit      Musculoskeletal and Integument   Facet syndrome, lumbar    Ongoing chronic problem; using OTC medicine      Relevant Orders   Ambulatory referral to Physical Therapy   DDD (degenerative disc disease), lumbar    Ongoing chronic problem;  using OTC medicine      Relevant Orders   Ambulatory referral to Physical Therapy     Other   Screening for breast cancer   Relevant Orders   MM Digital Screening   Well woman exam without gynecological exam - Primary    USPSTF grade A and B recommendations reviewed with patient; age-appropriate recommendations, preventive care, screening tests, etc discussed and encouraged; healthy living encouraged; see AVS for patient education given to patient      Relevant Orders   CBC with Differential/Platelet   COMPLETE METABOLIC PANEL WITH GFR   Lipid panel   TSH    Other Visit Diagnoses    Need for Tdap vaccination       Relevant Orders   Tdap vaccine greater than or equal to 7yo IM (Completed)   Varicose veins of left lower extremity with pain       Relevant Orders   Ambulatory referral to Vascular Surgery   Encounter for screening for HIV       Relevant Orders   HIV antibody       No orders of the defined types were placed in this encounter.  Orders Placed This Encounter  Procedures  . MM Digital Screening    Standing Status:   Future    Standing Expiration Date:   06/10/2018    Order Specific Question:   Reason for Exam (SYMPTOM  OR DIAGNOSIS REQUIRED)    Answer:   screen for breast cancer    Order Specific Question:   Is the patient pregnant?    Answer:   No    Order Specific Question:   Preferred imaging location?    Answer:   [facility]  . Tdap vaccine greater  than or equal to 7yo IM  . CBC with Differential/Platelet  . COMPLETE METABOLIC PANEL WITH GFR  . Lipid panel  . HIV antibody  . TSH  . Lipid panel  . COMPLETE METABOLIC PANEL WITH GFR  . TSH  . CBC with Differential/Platelet  . Hepatitis C antibody  . HIV antibody  . HCV RNA, Quantitative Real Time PCR  . Ambulatory referral to Physical Therapy    Referral Priority:   Routine    Referral Type:   Physical Medicine    Referral Reason:   Specialty Services Required    Requested Specialty:   Physical Therapy    Number of Visits Requested:   1  . Ambulatory referral to Vascular Surgery    Referral Priority:   Routine    Referral Type:   Surgical    Referral Reason:   Specialty Services Required    Requested Specialty:   Vascular Surgery    Number of Visits Requested:   1    Follow up plan: Return in about 1 year (around 04/10/2018) for complete physical.  An After Visit Summary was printed and given to the patient.

## 2017-04-12 LAB — COMPLETE METABOLIC PANEL WITH GFR
AG RATIO: 2 (calc) (ref 1.0–2.5)
ALKALINE PHOSPHATASE (APISO): 48 U/L (ref 33–115)
ALT: 14 U/L (ref 6–29)
AST: 28 U/L (ref 10–30)
Albumin: 4.9 g/dL (ref 3.6–5.1)
BILIRUBIN TOTAL: 0.5 mg/dL (ref 0.2–1.2)
BUN: 15 mg/dL (ref 7–25)
CHLORIDE: 103 mmol/L (ref 98–110)
CO2: 28 mmol/L (ref 20–32)
Calcium: 9.6 mg/dL (ref 8.6–10.2)
Creat: 0.79 mg/dL (ref 0.50–1.10)
GFR, EST NON AFRICAN AMERICAN: 91 mL/min/{1.73_m2} (ref >=60)
GFR, Est African American: 106 mL/min/{1.73_m2} (ref >=60)
GLOBULIN: 2.4 g/dL (ref 1.9–3.7)
Glucose, Bld: 83 mg/dL (ref 65–139)
POTASSIUM: 4.2 mmol/L (ref 3.5–5.3)
SODIUM: 138 mmol/L (ref 135–146)
Total Protein: 7.3 g/dL (ref 6.1–8.1)

## 2017-04-12 LAB — HIV ANTIBODY (ROUTINE TESTING W REFLEX): HIV 1&2 Ab, 4th Generation: NONREACTIVE

## 2017-04-12 LAB — CBC WITH DIFFERENTIAL/PLATELET
BASOS PCT: 1 %
Basophils Absolute: 60 cells/uL (ref 0–200)
EOS ABS: 192 {cells}/uL (ref 15–500)
EOS PCT: 3.2 %
HCT: 38.7 % (ref 35.0–45.0)
Hemoglobin: 13.2 g/dL (ref 11.7–15.5)
Lymphs Abs: 2862 cells/uL (ref 850–3900)
MCH: 33.2 pg — AB (ref 27.0–33.0)
MCHC: 34.1 g/dL (ref 32.0–36.0)
MCV: 97.2 fL (ref 80.0–100.0)
MONOS PCT: 8 %
MPV: 11.4 fL (ref 7.5–12.5)
Neutro Abs: 2406 cells/uL (ref 1500–7800)
Neutrophils Relative %: 40.1 %
Platelets: 284 10*3/uL (ref 140–400)
RBC: 3.98 10*6/uL (ref 3.80–5.10)
RDW: 11.7 % (ref 11.0–15.0)
TOTAL LYMPHOCYTE: 47.7 %
WBC mixed population: 480 cells/uL (ref 200–950)
WBC: 6 10*3/uL (ref 3.8–10.8)

## 2017-04-12 LAB — LIPID PANEL
CHOL/HDL RATIO: 1.7 (calc) (ref <5.0)
Cholesterol: 178 mg/dL (ref <200)
HDL: 105 mg/dL (ref >50)
LDL CHOLESTEROL (CALC): 54 mg/dL
Non-HDL Cholesterol (Calc): 73 mg/dL (calc) (ref <130)
TRIGLYCERIDES: 105 mg/dL (ref <150)

## 2017-04-12 LAB — TSH: TSH: 3.71 mIU/L

## 2017-04-12 LAB — HEPATITIS C ANTIBODY
Hepatitis C Ab: REACTIVE — AB
SIGNAL TO CUT-OFF: 26 — AB (ref <1.00)

## 2017-04-12 LAB — HCV RNA,QUANTITATIVE REAL TIME PCR
HCV Quantitative Log: <1.18 Log IU/mL
HCV RNA, PCR, QN: <15 IU/mL

## 2017-04-29 ENCOUNTER — Encounter (INDEPENDENT_AMBULATORY_CARE_PROVIDER_SITE_OTHER): Payer: BC Managed Care – PPO | Admitting: Vascular Surgery

## 2017-05-01 ENCOUNTER — Ambulatory Visit
Admission: RE | Admit: 2017-05-01 | Discharge: 2017-05-01 | Disposition: A | Discharge status: Home / Self Care | Payer: BC Managed Care – PPO | Source: Ambulatory Visit | Attending: Family Medicine | Admitting: Family Medicine

## 2017-05-01 DIAGNOSIS — R928 Other abnormal and inconclusive findings on diagnostic imaging of breast: Secondary | ICD-10-CM | POA: Insufficient documentation

## 2017-05-01 DIAGNOSIS — Z1231 Encounter for screening mammogram for malignant neoplasm of breast: Secondary | ICD-10-CM | POA: Insufficient documentation

## 2017-05-01 DIAGNOSIS — Z1239 Encounter for other screening for malignant neoplasm of breast: Secondary | ICD-10-CM

## 2017-05-02 ENCOUNTER — Other Ambulatory Visit: Payer: Self-pay | Admitting: Family Medicine

## 2017-05-02 DIAGNOSIS — N6320 Unspecified lump in the left breast, unspecified quadrant: Secondary | ICD-10-CM

## 2017-05-02 DIAGNOSIS — R928 Other abnormal and inconclusive findings on diagnostic imaging of breast: Secondary | ICD-10-CM

## 2017-05-06 ENCOUNTER — Ambulatory Visit: Payer: BC Managed Care – PPO

## 2017-05-13 ENCOUNTER — Ambulatory Visit (INDEPENDENT_AMBULATORY_CARE_PROVIDER_SITE_OTHER): Payer: BC Managed Care – PPO | Admitting: Vascular Surgery

## 2017-05-13 ENCOUNTER — Encounter (INDEPENDENT_AMBULATORY_CARE_PROVIDER_SITE_OTHER): Payer: Self-pay | Admitting: Vascular Surgery

## 2017-05-13 VITALS — BP 137/75 | HR 65 | Resp 17 | Ht 65.0 in | Wt 134.2 lb

## 2017-05-13 DIAGNOSIS — M79605 Pain in left leg: Secondary | ICD-10-CM

## 2017-05-13 DIAGNOSIS — I739 Peripheral vascular disease, unspecified: Secondary | ICD-10-CM | POA: Diagnosis not present

## 2017-05-13 DIAGNOSIS — Z720 Tobacco use: Secondary | ICD-10-CM | POA: Diagnosis not present

## 2017-05-13 NOTE — Progress Notes (Signed)
Subjective:    Patient ID: Kaitlyn Harvey, female    DOB: [birth year], 42 y.o.   MRN: [id number] Chief Complaint  Patient presents with  . New Patient (Initial Visit)    ref Lada for left leg pain   Presents as a new patient referred by Dr. Jacinto Reap for evaluation of left lower extremity pain.  The patient notes an area on the lateral aspect of her left leg from approximately the mid thigh to mid calf which cause her discomfort.  The patient notes there is an increase in number and size of the varicosities to that area.  The patient notes a general discomfort to the area was worsens towards the end of the day.  The patient notes that her symptoms have progressed to the point that she is unable to function on a daily basis.  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  The patient feels that her symptoms are no lifestyle limiting.  The patient denies any recent surgery or trauma to the bilateral lower extremity.  The patient denies any claudication-like symptoms, rest pain or ulceration to the bilateral lower extremity.  At this time, the patient does not engage in conservative therapy including wearing medical grade 1 compression socks.  The patient does elevate her legs heart level or higher.  The patient denies any fever, nausea vomiting.  Review of Systems  Constitutional: Negative.   HENT: Negative.   Eyes: Negative.   Respiratory: Negative.   Cardiovascular:       Painful varicose veins  Gastrointestinal: Negative.   Endocrine: Negative.   Genitourinary: Negative.   Musculoskeletal: Negative.   Skin: Negative.   Allergic/Immunologic: Negative.   Neurological: Negative.   Hematological: Negative.   Psychiatric/Behavioral: Negative.       Objective:   Physical Exam  Constitutional: She is oriented to person, place, and time. She appears well-developed and well-nourished. No distress.  HENT:  Head: Normocephalic and atraumatic.  Right  Ear: External ear normal.  Left Ear: External ear normal.  Eyes: Pupils are equal, round, and reactive to light. Conjunctivae and EOM are normal.  Neck: Normal range of motion.  Cardiovascular: Normal rate, regular rhythm, normal heart sounds and intact distal pulses.  Pulses:      Radial pulses are 2+ on the right side, and 2+ on the left side.       Dorsalis pedis pulses are 2+ on the right side, and 2+ on the left side.       Posterior tibial pulses are 2+ on the right side, and 2+ on the left side.  Pulmonary/Chest: Effort normal and breath sounds normal.  Musculoskeletal: Normal range of motion. She exhibits no edema.  Neurological: She is alert and oriented to person, place, and time.  Skin: Skin is warm and dry. She is not diaphoretic.  Less than 1 cm scattered varicosities to the left lateral thigh.  There is no stasis dermatitis or skin changes noted.  There is no active cellulitis or ulcerations noted.  Psychiatric: She has a normal mood and affect. Her behavior is normal. Judgment and thought content normal.  Vitals reviewed.  BP 137/75 (BP Location: Right Arm)   Pulse 65   Resp 17   Ht 5' 5"  (1.651 m)   Wt 134 lb 3.2 oz (60.9 kg)   LMP 04/26/2017 (Approximate)   BMI 22.33 kg/m   Past Medical History:  Diagnosis Date  . Arthritis  chronic back pain   Social History   Socioeconomic History  . Marital status: Single    Spouse name: Not on file  . Number of children: Not on file  . Years of education: Not on file  . Highest education level: Not on file  Occupational History  . Not on file  Social Needs  . Financial resource strain: Not on file  . Food insecurity:    Worry: Not on file    Inability: Not on file  . Transportation needs:    Medical: Not on file    Non-medical: Not on file  Tobacco Use  . Smoking status: Former Smoker    Packs/day: 0.50    Years: 20.00    Pack years: 10.00    Types: Cigarettes    Start date: 12/20/1994    Last attempt to  quit: 06/09/2015    Years since quitting: 1.9  . Smokeless tobacco: Never Used  Substance and Sexual Activity  . Alcohol use: Yes    Alcohol/week: 1.2 oz    Types: 2 Glasses of wine per week  . Drug use: No  . Sexual activity: Yes    Partners: Male    Birth control/protection: None    Comment: husband has a vasectomy  Lifestyle  . Physical activity:    Days per week: Not on file    Minutes per session: Not on file  . Stress: Not on file  Relationships  . Social connections:    Talks on phone: Not on file    Gets together: Not on file    Attends religious service: Not on file    Active member of club or organization: Not on file    Attends meetings of clubs or organizations: Not on file    Relationship status: Not on file  . Intimate partner violence:    Fear of current or ex partner: Not on file    Emotionally abused: Not on file    Physically abused: Not on file    Forced sexual activity: Not on file  Other Topics Concern  . Not on file  Social History Narrative  . Not on file   Past Surgical History:  Procedure Laterality Date  . UPPER GASTROINTESTINAL ENDOSCOPY    . UPPER GI ENDOSCOPY     Family History  Problem Relation Age of Onset  . Asthma Mother   . Hypertension Mother   . Thyroid disease Mother   . Other Mother        dilatation of ascending aorta  . Heart attack Father 25  . Hypertension Brother   . Bell's palsy Brother   . Heart murmur Brother   . Breast cancer Neg Hx    No Known Allergies     Assessment & Plan:  Presents as a new patient referred by Dr. Jacinto Reap for evaluation of left lower extremity pain.  The patient notes an area on the lateral aspect of her left leg from approximately the mid thigh to mid calf which cause her discomfort.  The patient notes there is an increase in number and size of the varicosities to that area.  The patient notes a general discomfort to the area was worsens towards the end of the day.  The patient notes that her  symptoms have progressed to the point that she is unable to function on a daily basis.  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  The patient  feels that her symptoms are no lifestyle limiting.  The patient denies any recent surgery or trauma to the bilateral lower extremity.  The patient denies any claudication-like symptoms, rest pain or ulceration to the bilateral lower extremity.  At this time, the patient does not engage in conservative therapy including wearing medical grade 1 compression socks.  The patient does elevate her legs heart level or higher.  The patient denies any fever, nausea vomiting.  1.  Left lower extremity pain - New Patient with palpable pedal pulses however when I bring her back to undergo a venous duplex I will also have her undergo an ABI to rule out any contributing peripheral artery disease to be thorough. The patient's symptoms sound like either lymphatic first venous disease.  I will bring the patient back to have her undergo bilateral venous duplex to rule out any contributing venous disease The patient was encouraged to wear graduated compression stockings (20-30 mmHg) on a daily basis. The patient was instructed to begin wearing the stockings first thing in the morning and removing them in the evening. The patient was instructed specifically not to sleep in the stockings. Prescription given.  In addition, behavioral modification including elevation during the day will be initiated. Anti-inflammatories for pain. The patient will follow up in one months to asses conservative management.  Information on chronic venous insufficiency and compression stockings was given to the patient. The patient was instructed to call the office in the interim if any worsening edema or ulcerations to the legs, feet or toes occurs. The patient expresses their understanding.  - VAS [place] ABI WITH/WO TBI; Future - VAS [place] LOWER EXTREMITY  VENOUS REFLUX; Future  2. Tobacco abuse - Stable We had a discussion for approximately 10 minutes regarding the absolute need for smoking cessation due to the deleterious nature of tobacco on the vascular system. We discussed the tobacco use would diminish patency of any intervention, and likely significantly worsen progressio of disease. We discussed multiple agents for quitting including replacement therapy or medications to reduce cravings such as Chantix. The patient voices their understanding of the importance of smoking cessation.  Current Outpatient Medications on File Prior to Visit  Medication Sig Dispense Refill  . Biotin 10 MG CAPS Take 1 capsule by mouth daily.    Marland Kitchen ibuprofen (ADVIL,MOTRIN) 600 MG tablet Take 600 mg by mouth every 6 (six) hours as needed.    . Multiple Vitamin (MULTIVITAMIN) tablet Take 1 tablet by mouth daily.    . benzonatate (TESSALON) 100 MG capsule Take 1 capsule (100 mg total) by mouth 2 (two) times daily as needed for cough. (Patient not taking: Reported on 05/13/2017) 30 capsule 0   No current facility-administered medications on file prior to visit.    There are no Patient Instructions on file for this visit. No follow-ups on file.  Spencer Cardinal A Darrien Laakso, PA-C

## 2017-05-14 ENCOUNTER — Ambulatory Visit
Admission: RE | Admit: 2017-05-14 | Discharge: 2017-05-14 | Disposition: A | Discharge status: Home / Self Care | Payer: BC Managed Care – PPO | Source: Ambulatory Visit | Attending: Family Medicine | Admitting: Family Medicine

## 2017-05-14 DIAGNOSIS — N6320 Unspecified lump in the left breast, unspecified quadrant: Secondary | ICD-10-CM

## 2017-05-14 DIAGNOSIS — R928 Other abnormal and inconclusive findings on diagnostic imaging of breast: Secondary | ICD-10-CM | POA: Diagnosis not present

## 2017-07-01 ENCOUNTER — Ambulatory Visit (INDEPENDENT_AMBULATORY_CARE_PROVIDER_SITE_OTHER): Payer: BC Managed Care – PPO | Admitting: Vascular Surgery

## 2017-07-01 ENCOUNTER — Encounter (INDEPENDENT_AMBULATORY_CARE_PROVIDER_SITE_OTHER): Payer: BC Managed Care – PPO

## 2017-08-05 ENCOUNTER — Encounter (INDEPENDENT_AMBULATORY_CARE_PROVIDER_SITE_OTHER): Payer: BC Managed Care – PPO

## 2017-08-05 ENCOUNTER — Ambulatory Visit (INDEPENDENT_AMBULATORY_CARE_PROVIDER_SITE_OTHER): Payer: BC Managed Care – PPO | Admitting: Vascular Surgery

## 2017-12-24 ENCOUNTER — Ambulatory Visit: Payer: BC Managed Care – PPO | Admitting: Family Medicine

## 2017-12-24 ENCOUNTER — Encounter: Payer: Self-pay | Admitting: Family Medicine

## 2017-12-24 VITALS — BP 128/72 | HR 69 | Temp 97.6°F | Ht 65.0 in | Wt 138.2 lb

## 2017-12-24 DIAGNOSIS — M533 Sacrococcygeal disorders, not elsewhere classified: Secondary | ICD-10-CM

## 2017-12-24 DIAGNOSIS — S22000S Wedge compression fracture of unspecified thoracic vertebra, sequela: Secondary | ICD-10-CM

## 2017-12-24 DIAGNOSIS — Z87891 Personal history of nicotine dependence: Secondary | ICD-10-CM

## 2017-12-24 DIAGNOSIS — S22070A Wedge compression fracture of T9-T10 vertebra, initial encounter for closed fracture: Secondary | ICD-10-CM | POA: Insufficient documentation

## 2017-12-24 DIAGNOSIS — M5136 Other intervertebral disc degeneration, lumbar region: Secondary | ICD-10-CM | POA: Diagnosis not present

## 2017-12-24 HISTORY — DX: Wedge compression fracture of T9-T10 vertebra, initial encounter for closed fracture: S22.070A

## 2017-12-24 HISTORY — DX: Wedge compression fracture of unspecified thoracic vertebra, sequela: S22.000S

## 2017-12-24 MED ORDER — IBUPROFEN 600 MG PO TABS: 600.0000 mg | ORAL_TABLET | Freq: Four times a day (QID) | ORAL | 0 refills | Status: DC | PRN

## 2017-12-24 NOTE — Assessment & Plan Note (Signed)
Reviewed old xrays from 2015; order DEXA

## 2017-12-24 NOTE — Assessment & Plan Note (Addendum)
Chronic problem; reviewed PMP Aware for the last 2 years ([place], [place], [place], [place]) and no narcotic pain medicines filled; refer to Dr. Holley Raring

## 2017-12-24 NOTE — Assessment & Plan Note (Signed)
History of smoking; so proud of patient for quitting smoking
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
## 2017-12-24 NOTE — Assessment & Plan Note (Signed)
Refer to pain clinic

## 2017-12-24 NOTE — Patient Instructions (Addendum)
Please do call to schedule your bone density study; the number to schedule one at either [facility] or [facility] is [phone] or [phone]  We'll have you see Dr. Holley Raring at the pain clinic

## 2017-12-30 ENCOUNTER — Ambulatory Visit
Admission: RE | Admit: 2017-12-30 | Discharge: 2017-12-30 | Disposition: A | Discharge status: Home / Self Care | Payer: BC Managed Care – PPO | Source: Ambulatory Visit | Attending: Family Medicine | Admitting: Family Medicine

## 2017-12-30 ENCOUNTER — Ambulatory Visit
Admission: RE | Admit: 2017-12-30 | Discharge: 2017-12-30 | Disposition: A | Discharge status: Home / Self Care | Payer: BC Managed Care – PPO | Attending: Family Medicine | Admitting: Family Medicine

## 2017-12-30 DIAGNOSIS — S22000S Wedge compression fracture of unspecified thoracic vertebra, sequela: Secondary | ICD-10-CM | POA: Diagnosis present

## 2017-12-30 DIAGNOSIS — M549 Dorsalgia, unspecified: Secondary | ICD-10-CM | POA: Insufficient documentation

## 2017-12-30 DIAGNOSIS — M5136 Other intervertebral disc degeneration, lumbar region: Secondary | ICD-10-CM

## 2017-12-31 ENCOUNTER — Telehealth: Payer: Self-pay

## 2017-12-31 NOTE — Telephone Encounter (Signed)
    Copied from [place] [id number]. Topic: Quick Communication - Other Results (Clinic Use ONLY) >> Dec 31, 2017 12:47 PM Scherrie Gerlach wrote: Pt calling for xray results done yesterday

## 2017-12-31 NOTE — Telephone Encounter (Signed)
Pt.notified

## 2017-12-31 NOTE — Telephone Encounter (Signed)
You may read her the results Stable, chronic findings; nothing new

## 2018-01-06 ENCOUNTER — Other Ambulatory Visit: Payer: Self-pay

## 2018-01-06 ENCOUNTER — Ambulatory Visit: Payer: Self-pay | Admitting: *Deleted

## 2018-01-06 DIAGNOSIS — M5137 Other intervertebral disc degeneration, lumbosacral region: Secondary | ICD-10-CM

## 2018-01-06 NOTE — Telephone Encounter (Signed)
Pt notified. Referral entered. Work note is up front.

## 2018-01-06 NOTE — Telephone Encounter (Signed)
How someone lifts is more important than how much someone lifts She needs to use good back mechanics, Hoyer lift if necessary, etc. Please REFER her for a functional capacity evaluation (FCE) with physical therapy, dx DDD Until she is seen, please limit her to lifting no more than 20 pounds; the weight limit may change after the FCE and we can update the letter at that time Please write and I'll sign Thank you

## 2018-01-06 NOTE — Telephone Encounter (Signed)
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   Reason for Disposition . [1] Caller requesting NON-URGENT health information AND [2] PCP's office is the best resource  Answer Assessment - Initial Assessment Questions 1. SYMPTOMS: "Do you have any symptoms?"     No; question about lift restrictions 2. SEVERITY: If symptoms are present, ask "Are they mild, moderate or severe?"     n/a  Answer Assessment - Initial Assessment Questions 1. REASON FOR CALL or QUESTION: "What is your reason for calling today?" or "How can I best help you?" or "What question do you have that I can help answer?"     Weight restrictions for lifting  Protocols used: INFORMATION ONLY CALL-A-AH, MEDICATION QUESTION CALL-A-AH

## 2018-01-06 NOTE — Telephone Encounter (Signed)
Message from [employer] sent at 01/06/2018 9:12 AM EST   Summary: what can pt lift   Pt calling to speak with a nurse: Pt states that she was seen for back pain and was referred to a specialist. Pt wants to know what is the weight limit of what she can lift right now and will she need a work note. 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

## 2018-01-08 ENCOUNTER — Ambulatory Visit
Admission: RE | Admit: 2018-01-08 | Discharge: 2018-01-08 | Disposition: A | Discharge status: Home / Self Care | Payer: BC Managed Care – PPO | Source: Ambulatory Visit | Attending: Family Medicine | Admitting: Family Medicine

## 2018-01-08 DIAGNOSIS — M5136 Other intervertebral disc degeneration, lumbar region: Secondary | ICD-10-CM | POA: Diagnosis not present

## 2018-01-08 DIAGNOSIS — S22000S Wedge compression fracture of unspecified thoracic vertebra, sequela: Secondary | ICD-10-CM | POA: Diagnosis present

## 2018-01-09 NOTE — Progress Notes (Signed)
Kaitlyn Harvey, please let the patient know that her bone density test was read as normal. She does appear to be down near the limit though in her back (normal right now, but getting close to osteopenia). Encourage weight bearing exercise, adequate calcium (1200 mg daily divided into 2-3 doses), 800 to 1000 iu of OTC vitamin D3 once a day, fall precautions, etc. We can recheck her bone density in three to five years.

## 2018-01-14 ENCOUNTER — Telehealth: Payer: Self-pay | Admitting: Family Medicine

## 2018-01-14 MED ORDER — IBUPROFEN 600 MG PO TABS: 600.0000 mg | ORAL_TABLET | Freq: Four times a day (QID) | ORAL | 0 refills | Status: DC | PRN

## 2018-01-14 NOTE — Telephone Encounter (Signed)
Yes, Rx sent

## 2018-01-14 NOTE — Telephone Encounter (Signed)
Pt.notified

## 2018-01-14 NOTE — Telephone Encounter (Signed)
Copied from [place] [id number]. Topic: Quick Communication - See Telephone Encounter >> Jan 14, 2018 11:29 AM Vernona Rieger wrote: CRM for notification. See Telephone encounter for: 01/14/18.  Patient states she does not see the pain clinic until 2/11 and is wondering if Dr Sanda Klein will refill her ibuprofen (ADVIL,MOTRIN) 600 MG tablet to get her until the appt. Please Advise  [id number] [id number] [id number]

## 2018-02-11 ENCOUNTER — Encounter: Payer: Self-pay | Admitting: Student in an Organized Health Care Education/Training Program

## 2018-02-11 ENCOUNTER — Other Ambulatory Visit: Payer: Self-pay

## 2018-02-11 ENCOUNTER — Ambulatory Visit
Admission: RE | Admit: 2018-02-11 | Discharge: 2018-02-11 | Disposition: A | Discharge status: Home / Self Care | Payer: BC Managed Care – PPO | Source: Ambulatory Visit | Attending: Student in an Organized Health Care Education/Training Program | Admitting: Student in an Organized Health Care Education/Training Program

## 2018-02-11 ENCOUNTER — Ambulatory Visit: Payer: BC Managed Care – PPO | Admitting: Student in an Organized Health Care Education/Training Program

## 2018-02-11 VITALS — BP 118/83 | HR 76 | Temp 98.5°F | Resp 16 | Ht 65.0 in | Wt 138.8 lb

## 2018-02-11 DIAGNOSIS — M7918 Myalgia, other site: Secondary | ICD-10-CM | POA: Insufficient documentation

## 2018-02-11 DIAGNOSIS — G8929 Other chronic pain: Secondary | ICD-10-CM | POA: Insufficient documentation

## 2018-02-11 DIAGNOSIS — S22070S Wedge compression fracture of T9-T10 vertebra, sequela: Secondary | ICD-10-CM

## 2018-02-11 DIAGNOSIS — M545 Low back pain: Secondary | ICD-10-CM | POA: Insufficient documentation

## 2018-02-11 DIAGNOSIS — M533 Sacrococcygeal disorders, not elsewhere classified: Secondary | ICD-10-CM

## 2018-02-11 DIAGNOSIS — M5136 Other intervertebral disc degeneration, lumbar region: Secondary | ICD-10-CM | POA: Insufficient documentation

## 2018-02-11 DIAGNOSIS — G894 Chronic pain syndrome: Secondary | ICD-10-CM | POA: Insufficient documentation

## 2018-02-11 MED ORDER — DICLOFENAC SODIUM 75 MG PO TBEC: 75.0000 mg | DELAYED_RELEASE_TABLET | Freq: Two times a day (BID) | ORAL | 0 refills | Status: DC

## 2018-02-11 NOTE — Progress Notes (Deleted)
Patient's Name: Kaitlyn Harvey  MRN: [id number]  Referring Provider: Arnetha Courser, MD  DOB: Mar 27, 1975  PCP: Arnetha Courser, MD  DOS: 02/11/2018  Note by: Gillis Santa, MD  Service setting: Ambulatory outpatient  Specialty: Interventional Pain Management  Patient type: Established  Location: [facility] (AMB) Pain Management Facility  Visit type: Interventional Procedure   Primary Reason for Admission: Surgical management of chronic pain condition.  Procedure:  Anesthesia, Analgesia, Anxiolysis:  Type: Trial Spinal Cord Neurostimulator Implant (Percutaneous, interlaminar, posterior epidural placement) Purpose: To determine if a permanent implant may be effective in controlling some or all of Ms. Coffin's chronic pain symptoms.  Region:  ***  Level:  ***  Laterality:  ***   ***   Type: Moderate (Conscious) Sedation combined with Local Anesthesia Indication(s): Analgesia and Anxiety Route: Intravenous (IV) IV Access: Secured Sedation: Meaningful verbal contact was maintained at all times during the procedure  Local Anesthetic: Lidocaine 1-2%   Indications: No diagnosis found. Pain Score: Pre-procedure:  /10 Post-procedure:  /10  Pre-op Assessment:  Ms. Ulrey is a 43 y.o. (year old), female patient, seen today for interventional treatment. She  has a past surgical history that includes Upper gi endoscopy and Upper gastrointestinal endoscopy.  Initial Vital Signs:  Pulse/EKG Rate:    Temp:   Resp:   BP:   SpO2:    BMI: Estimated body mass index is 23 kg/m as calculated from the following:   Height as of 12/24/17: 5' 5"  (1.651 m).   Weight as of 12/24/17: 138 lb 3.2 oz (62.7 kg).  Risk Assessment: Allergies: Reviewed. She has No Known Allergies.  Allergy Precautions: None required Coagulopathies: Reviewed. None identified.  Blood-thinner therapy: None at this time Active Infection(s): Reviewed. None identified. Ms. Mallo is afebrile  Site Confirmation: Ms. Karis was asked to confirm the  procedure and laterality before marking the site, which she did. Procedure checklist: Completed Consent: Before the procedure and under the influence of no sedative(s), amnesic(s), or anxiolytics, the patient was informed of the treatment options, risks and possible complications. To fulfill our ethical and legal obligations, as recommended by the American Medical Association's Code of Ethics, I have informed the patient of my clinical impression; the nature and purpose of the treatment or procedure; the risks, benefits, and possible complications of the intervention; the alternatives, including doing nothing; the risk(s) and benefit(s) of the alternative treatment(s) or procedure(s); and the risk(s) and benefit(s) of doing nothing.  Ms. Pittsley was provided with information about the general risks and possible complications associated with most interventional procedures. These include, but are not limited to: failure to achieve desired goals, infection, bleeding, organ or nerve damage, allergic reactions, paralysis, and/or death.  In addition, she was informed of those risks and possible complications associated to this particular procedure, which include, but are not limited to: damage to the implant; failure to decrease pain; local, systemic, or serious CNS infections, intraspinal abscess with possible cord compression and paralysis, or life-threatening such as meningitis; intrathecal and/or epidural bleeding with formation of hematoma with possible spinal cord compression and permanent paralysis; organ damage; nerve injury or damage with subsequent sensory, motor, and/or autonomic system dysfunction, resulting in transient or permanent pain, numbness, and/or weakness of one or several areas of the body; allergic reactions, either minor or major life-threatening, such as anaphylactic or anaphylactoid reactions.  Furthermore, Ms. Muha was informed of those risks and complications associated with the  medications. These include, but are not limited to: allergic reactions (i.e.: anaphylactic  or anaphylactoid reactions); arrhythmia;  Hypotension/hypertension; cardiovascular collapse; respiratory depression and/or shortness of breath; swelling or edema; medication-induced neural toxicity; particulate matter embolism and blood vessel occlusion with resultant organ, and/or nervous system infarction and permanent paralysis.  Finally, she was informed that Medicine is not an exact science; therefore, there is also the possibility of unforeseen or unpredictable risks and/or possible complications that may result in a catastrophic outcome. The patient indicated having understood very clearly. We have given the patient no guarantees and we have made no promises. Enough time was given to the patient to ask questions, all of which were answered to the patient's satisfaction. Ms. Eckerson has indicated that she wanted to continue with the procedure. Attestation: I, the ordering provider, attest that I have discussed with the patient the benefits, risks, side-effects, alternatives, likelihood of achieving goals, and potential problems during recovery for the procedure that I have provided informed consent. Date  Time: {CHL [facility]-PAIN TIME CHOICES:[id number]}  Pre-Procedure Preparation:  Monitoring: As per clinic protocol. Respiration, ETCO2, SpO2, BP, heart rate and rhythm monitor placed and checked for adequate function Safety Precautions: Patient was assessed for positional comfort and pressure points before starting the procedure. Time-out: I initiated and conducted the "Time-out" before starting the procedure, as per protocol. The patient was asked to participate by confirming the accuracy of the "Time Out" information. Verification of the correct person, site, and procedure were performed and confirmed by me, the nursing staff, and the patient. "Time-out" conducted as per Joint Commission's Universal Protocol  (UP.01.01.01). Time:    Description of Procedure Process:   Position: Prone Target Area: Posterior epidural space Approach: Posterior percutaneous, paramedial, interlaminar approach Area Prepped: Bilateral  ***  Region Prepping solution: ChloraPrep (2% chlorhexidine gluconate and 70% isopropyl alcohol) Safety Precautions: Safe injection practices and needle disposal techniques used. Medications properly checked for expiration dates. SDV (single dose vial) medications used. Aspiration looking for blood return and/or CSF was conducted prior to all injections. At no point did I inject any substances, as a needle was being advanced. No attempts were made at seeking any paresthesias.  Description of the Procedure: Availability of a responsible, adult driver, and NPO status confirmed. Informed consent was obtained after having discussed risks and possible complications. An IV was started. The patient was then taken to the fluoroscopy suite, where the patient was placed in position for the procedure, over the fluoroscopy table. The patient was then monitored in the usual manner. Fluoroscopy was manipulated to obtain the best possible view of the target. Parallex error was corrected before commencing the procedure. Once a clear view of the target had been obtained, the skin and deeper tissues over the procedure site were infiltrated using lidocaine, loaded in a 10 cc luer-loc syringe with a 0.5 inch, 25-G needle. The introducer needle(s) was/were then inserted through the skin and deeper tissues. A paramidline approach was used to enter the posterior epidural space at a 30 angle, using "Loss-of-resistance Technique" with 3 ml of PF-NaCl (0.9% NSS). Correct needle placement was confirmed in the antero-posterior and lateral fluoroscopic views. The lead was gently introduced and manipulated under real-time fluoroscopy, constantly assessing for pain, discomfort, or paresthesias, until the tip rested at the desired  level.  ***  Electrode placement was tested until appropriate coverage was attained. Once the patient confirmed that the stimulation was over the desired area, the lead(s) was/were secured in place and the introducer needles removed. This was done under real-time fluoroscopy while observing the electrode tip to avoid  unintended migration. The area was covered with a non-occlusive dressing and the patient transported to recovery for further programming.  There were no vitals filed for this visit. Start Time:   hrs. End Time:   hrs.  Neurostimulator Details:   Lead(s):  Brand: Medtronic Epidural Access Level:   ***          Lead implant:   ***    No. of Electrodes/Lead:   ***            Laterality:   ***            Top electrode location:   ***          Bottom electrode location:   ***          Model No.: ***         Length: *** cm         Lot No.: ***           MRI compatibility:  Yes           External Neurostimulator    Model No.: ***   Serial No.: ***    Imaging Guidance (Spinal):          Type of Imaging Technique: Fluoroscopy Guidance (Spinal) Indication(s): Assistance in needle guidance and placement for procedures requiring needle placement in or near specific anatomical locations not easily accessible without such assistance. Exposure Time: Please see nurses notes. Contrast: None used. Fluoroscopic Guidance: I was personally present during the use of fluoroscopy. "Tunnel Vision Technique" used to obtain the best possible view of the target area. Parallax error corrected before commencing the procedure. "Direction-depth-direction" technique used to introduce the needle under continuous pulsed fluoroscopy. Once target was reached, antero-posterior, oblique, and lateral fluoroscopic projection used confirm needle placement in all planes. Images permanently stored in EMR. Interpretation: No contrast injected. I personally interpreted the imaging intraoperatively. Adequate needle  placement confirmed in multiple planes. Permanent images saved into the patient's record.  Antibiotic Prophylaxis:   Anti-infectives (From admission, onward)   None     Indication(s): None identified  Post-operative Assessment:  Post-procedure Vital Signs:  Pulse/HCG Rate:    Temp:   Resp:   BP:   SpO2:    Complications: No immediate post-treatment complications observed by team, or reported by patient.  Note: The patient tolerated the entire procedure well. A repeat set of vitals were taken after the procedure and the patient was kept under observation following institutional policy, for this type of procedure. Post-procedural neurological assessment was performed, showing return to baseline, prior to discharge. The patient was provided with post-procedure discharge instructions, including a section on how to identify potential problems. Should any problems arise concerning this procedure, the patient was given instructions to immediately contact us, at any time, without hesitation. In any case, we plan to contact the patient by telephone for a follow-up status report regarding this interventional procedure.  Comments:  No additional relevant information.  Plan of Care   Imaging Orders  No imaging studies ordered today   Procedure Orders    No procedure(s) ordered today    Medications ordered for procedure: No orders of the defined types were placed in this encounter.  Medications administered: [name] had no medications administered during this visit.  See the medical record for exact dosing, route, and time of administration.  Disposition: Discharge home  Discharge Date & Time: 02/11/2018;   hrs.   Physician-requested Follow-up: Return for Medication Management.  Future Appointments  Date Time Provider [place]  04/29/2018  8:00 AM Lada, Satira Anis, MD [place] [facility]   Primary Care Physician: Arnetha Courser, MD Location: [facility]  Facility Note by: Gillis Santa, MD Date: 02/11/2018; Time: 8:08 AM  Disclaimer:  Medicine is not an exact science. The only guarantee in medicine is that nothing is guaranteed. It is important to note that the decision to proceed with this intervention was based on the information collected from the patient. The Data and conclusions were drawn from the patient's questionnaire, the interview, and the physical examination. Because the information was provided in large part by the patient, it cannot be guaranteed that it has not been purposely or unconsciously manipulated. Every effort has been made to obtain as much relevant data as possible for this evaluation. It is important to note that the conclusions that lead to this procedure are derived in large part from the available data. Always take into account that the treatment will also be dependent on availability of resources and existing treatment guidelines, considered by other Pain Management Practitioners as being common knowledge and practice, at the time of the intervention. For Medico-Legal purposes, it is also important to point out that variation in procedural techniques and pharmacological choices are the acceptable norm. The indications, contraindications, technique, and results of the above procedure should only be interpreted and judged by a Board-Certified Interventional Pain Specialist with extensive familiarity and expertise in the same exact procedure and technique.
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
## 2018-02-11 NOTE — Progress Notes (Signed)
Safety precautions to be maintained throughout the outpatient stay will include: orient to surroundings, keep bed in low position, maintain call bell within reach at all times, provide assistance with transfer out of bed and ambulation.  

## 2018-02-11 NOTE — Progress Notes (Deleted)
Patient's Name: Kaitlyn Harvey  MRN: [id number]  Referring Provider: Arnetha Courser, MD  DOB: 11-06-1975  PCP: Arnetha Courser, MD  DOS: 02/11/2018  Note by: Gillis Santa, MD  Service setting: Ambulatory outpatient  Specialty: Interventional Pain Management  Location: [facility] (AMB) Pain Management Facility  Visit type: Initial Patient Evaluation  Patient type: New Patient   Primary Reason(s) for Visit: Encounter for initial evaluation of one or more chronic problems (new to examiner) potentially causing chronic pain, and posing a threat to normal musculoskeletal function. (Level of risk: High) CC: Back Pain (lower)  HPI  Kaitlyn Harvey is a 43 y.o. year old, female patient, who comes today to see [place] for the first time for an initial evaluation of her chronic pain. She has Well woman exam without gynecological exam; Tobacco abuse; Screening for breast cancer; DDD (degenerative disc disease), lumbar; Facet syndrome, lumbar; Sacroiliac joint dysfunction; Antibiotic-induced yeast infection; Murmur; Palpitations; Thoracic compression fracture, sequela; and Hx of tobacco use, presenting hazards to health on their problem list. Today she comes in for evaluation of her Back Pain (lower)  Pain Assessment: Location: Lower, Right, Left Back Radiating: denies Onset: More than a month ago Duration: Chronic pain Quality: Aching, Sore, Sharp(sometimes stinging) Severity: 7 /10 (subjective, self-reported pain score)  Note: Reported level is compatible with observation.                         When using our objective Pain Scale, levels between 6 and 10/10 are said to belong in an emergency room, as it progressively worsens from a 6/10, described as severely limiting, requiring emergency care not usually available at an outpatient pain management facility. At a 6/10 level, communication becomes difficult and requires great effort. Assistance to reach the emergency department may be required. Facial flushing and profuse sweating  along with potentially dangerous increases in heart rate and blood pressure will be evident. Effect on ADL: distracting at times Timing: Intermittent Modifying factors: tylenol, ibuprofen BP: 118/83  HR: 76  Onset and Duration: {Hx; Onset and Duration:210120511} Cause of pain: {Hx; Cause:210120521} Severity: {Pain Severity:210120502} Timing: {Symptoms; Timing:210120501} Aggravating Factors: {Causes; Aggravating pain factors:210120507} Alleviating Factors: {Causes; Alleviating Factors:210120500} Associated Problems: {Hx; Associated problems:210120515} Quality of Pain: {Hx; Symptom quality or Descriptor:210120531} Previous Examinations or Tests: {Hx; Previous examinations or test:210120529} Previous Treatments: {Hx; Previous Treatment:210120503}  The patient comes into the clinics today for the first time for a chronic pain management evaluation. ***  Today I took the time to provide the patient with information regarding my pain practice. The patient was informed that my practice is divided into two sections: an interventional pain management section, as well as a completely separate and distinct medication management section. I explained that I have procedure days for my interventional therapies, and evaluation days for follow-ups and medication management. Because of the amount of documentation required during both, they are kept separated. This means that there is the possibility that she may be scheduled for a procedure on one day, and medication management the next. I have also informed her that because of staffing and facility limitations, I no longer take patients for medication management only. To illustrate the reasons for this, I gave the patient the example of surgeons, and how inappropriate it would be to refer a patient to his/her care, just to write for the post-surgical antibiotics on a surgery done by a different surgeon.   Because interventional pain management is my  board-certified specialty, the  patient was informed that joining my practice means that they are open to any and all interventional therapies. I made it clear that this does not mean that they will be forced to have any procedures done. What this means is that I believe interventional therapies to be essential part of the diagnosis and proper management of chronic pain conditions. Therefore, patients not interested in these interventional alternatives will be better served under the care of a different practitioner.  The patient was also made aware of my Comprehensive Pain Management Safety Guidelines where by joining my practice, they limit all of their nerve blocks and joint injections to those done by our practice, for as long as we are retained to manage their care.   Historic Controlled Substance Pharmacotherapy Review  PMP and historical list of controlled substances: ***  Highest opioid analgesic regimen found: ***  Most recent opioid analgesic: ***  Current opioid analgesics: ***  Highest recorded MME/day: *** mg/day MME/day: *** mg/day Medications: The patient did not bring the medication(s) to the appointment, as requested in our "New Patient Package" Pharmacodynamics: Desired effects: Analgesia: The patient reports >50% benefit. Reported improvement in function: The patient reports medication allows her to accomplish basic ADLs. Clinically meaningful improvement in function (CMIF): Sustained CMIF goals met Perceived effectiveness: Described as relatively effective, allowing for increase in activities of daily living (ADL) Undesirable effects: Side-effects or Adverse reactions: None reported Historical Monitoring: The patient  reports no history of drug use. List of all UDS Test(s): No results found for: MDMA, COCAINSCRNUR, [place], [place], CANNABQUANT, [place], [place] List of other Serum/Urine Drug Screening Test(s):  No results found for: AMPHSCRSER, BARBSCRSER, BENZOSCRSER,  COCAINSCRSER, COCAINSCRNUR, PCPSCRSER, PCPQUANT, THCSCRSER, THCU, CANNABQUANT, OPIATESCRSER, OXYSCRSER, PROPOXSCRSER, ETH Historical Background Evaluation: [place] PMP: Six (6) year initial data search conducted.             PMP NARX Score Report:  Narcotic: *** Sedative: *** Stimulant: *** [place] Department of public safety, offender search: Editor, commissioning Information) Non-contributory Risk Assessment Profile: Aberrant behavior: None observed or detected today Risk factors for fatal opioid overdose: None identified today PMP NARX Overdose Risk Score: *** Fatal overdose hazard ratio (HR): Calculation deferred Non-fatal overdose hazard ratio (HR): Calculation deferred Risk of opioid abuse or dependence: 0.7-3.0% with doses ? 36 MME/day and 6.1-26% with doses ? 120 MME/day. Substance use disorder (SUD) risk level: See below Personal History of Substance Abuse (SUD-Substance use disorder):  Alcohol: Negative  Illegal Drugs: Negative  Rx Drugs: Negative  ORT Risk Level calculation: Low Risk Opioid Risk Tool - 02/11/18 0817      Family History of Substance Abuse   Alcohol  Negative    Illegal Drugs  Negative    Rx Drugs  Negative      Personal History of Substance Abuse   Alcohol  Negative    Illegal Drugs  Negative    Rx Drugs  Negative      Age   Age between 31-45 years   Yes      History of Preadolescent Sexual Abuse   History of Preadolescent Sexual Abuse  Negative or Female      Psychological Disease   Psychological Disease  Negative    Depression  Negative      Total Score   Opioid Risk Tool Scoring  1    Opioid Risk Interpretation  Low Risk      ORT Scoring interpretation table:  Score <3 = Low Risk for SUD  Score between 4-7 = Moderate Risk  for SUD  Score >8 = High Risk for Opioid Abuse   PHQ-2 Depression Scale:  Total score: 0  PHQ-2 Scoring interpretation table: (Score and probability of major depressive disorder)  Score 0 = No depression  Score 1 = 15.4% Probability   Score 2 = 21.1% Probability  Score 3 = 38.4% Probability  Score 4 = 45.5% Probability  Score 5 = 56.4% Probability  Score 6 = 78.6% Probability   PHQ-9 Depression Scale:  Total score: 0  PHQ-9 Scoring interpretation table:  Score 0-4 = No depression  Score 5-9 = Mild depression  Score 10-14 = Moderate depression  Score 15-19 = Moderately severe depression  Score 20-27 = Severe depression (2.4 times higher risk of SUD and 2.89 times higher risk of overuse)   Pharmacologic Plan: As per protocol, I have not taken over any controlled substance management, pending the results of ordered tests and/or consults.            Initial impression: Pending review of available data and ordered tests.  Meds   Current Outpatient Medications:  .  acetaminophen (TYLENOL) 500 MG tablet, Take 500 mg by mouth every 6 (six) hours as needed., Disp: , Rfl:  .  Biotin 10 MG CAPS, Take 1 capsule by mouth daily., Disp: , Rfl:  .  calcium-vitamin D (OSCAL WITH D) 500-200 MG-UNIT tablet, Take 1 tablet by mouth., Disp: , Rfl:  .  ibuprofen (ADVIL,MOTRIN) 600 MG tablet, Take 1 tablet (600 mg total) by mouth every 6 (six) hours as needed. Take with food; stop if abd pain or blood in stool, Disp: 40 tablet, Rfl: 0 .  Multiple Vitamin (MULTIVITAMIN) tablet, Take 1 tablet by mouth daily., Disp: , Rfl:   Imaging Review  Cervical Imaging: Cervical MR wo contrast: No results found for this or any previous visit. Cervical MR wo contrast: No procedure found. Cervical MR w/wo contrast: No results found for this or any previous visit. Cervical MR w contrast: No results found for this or any previous visit. Cervical CT wo contrast: No results found for this or any previous visit. Cervical CT w/wo contrast: No results found for this or any previous visit. Cervical CT w/wo contrast: No results found for this or any previous visit. Cervical CT w contrast: No results found for this or any previous visit. Cervical CT outside:  No results found for this or any previous visit. Cervical DG 1 view: No results found for this or any previous visit. Cervical DG 2-3 views: No results found for this or any previous visit. Cervical DG F/E views: No results found for this or any previous visit. Cervical DG 2-3 clearing views: No results found for this or any previous visit. Cervical DG Bending/F/E views: No results found for this or any previous visit. Cervical DG complete: No results found for this or any previous visit. Cervical DG Myelogram views: No results found for this or any previous visit. Cervical DG Myelogram views: No results found for this or any previous visit. Cervical Discogram views: No results found for this or any previous visit.  Shoulder Imaging: Shoulder-R MR w contrast: No results found for this or any previous visit. Shoulder-L MR w contrast: No results found for this or any previous visit. Shoulder-R MR w/wo contrast: No results found for this or any previous visit. Shoulder-L MR w/wo contrast: No results found for this or any previous visit. Shoulder-R MR wo contrast: No results found for this or any previous visit. Shoulder-L MR wo  contrast: No results found for this or any previous visit. Shoulder-R CT w contrast: No results found for this or any previous visit. Shoulder-L CT w contrast: No results found for this or any previous visit. Shoulder-R CT w/wo contrast: No results found for this or any previous visit. Shoulder-L CT w/wo contrast: No results found for this or any previous visit. Shoulder-R CT wo contrast: No results found for this or any previous visit. Shoulder-L CT wo contrast: No results found for this or any previous visit. Shoulder-R DG Arthrogram: No results found for this or any previous visit. Shoulder-L DG Arthrogram: No results found for this or any previous visit. Shoulder-R DG 1 view: No results found for this or any previous visit. Shoulder-L DG 1 view: No results found  for this or any previous visit. Shoulder-R DG: No results found for this or any previous visit. Shoulder-L DG: No results found for this or any previous visit.  Thoracic Imaging: Thoracic MR wo contrast: No results found for this or any previous visit. Thoracic MR wo contrast: No procedure found. Thoracic MR w/wo contrast: No results found for this or any previous visit. Thoracic MR w contrast: No results found for this or any previous visit. Thoracic CT wo contrast: No results found for this or any previous visit. Thoracic CT w/wo contrast: No results found for this or any previous visit. Thoracic CT w/wo contrast: No results found for this or any previous visit. Thoracic CT w contrast: No results found for this or any previous visit. Thoracic DG 2-3 views:  Results for orders placed during the hospital encounter of 12/30/17  DG Thoracic Spine 2 View   Narrative CLINICAL DATA:  Lower back pain after fall down stairs 3 weeks ago.  EXAM: THORACIC SPINE 2 VIEWS  COMPARISON:  Radiographs of March 31, 2013.  FINDINGS: Stable old compression fractures are seen involving the T9 and T10 vertebral bodies. No acute fracture or spondylolisthesis is noted. Disc spaces are unremarkable.  IMPRESSION: Stable old T9 and T10 compression fractures. No acute abnormality seen in the thoracic spine.   Electronically Signed   By: Marijo Conception, M.D.   On: 12/30/2017 09:42    Thoracic DG 4 views: No results found for this or any previous visit. Thoracic DG: No results found for this or any previous visit. Thoracic DG w/swimmers view: No results found for this or any previous visit. Thoracic DG Myelogram views: No results found for this or any previous visit. Thoracic DG Myelogram views: No results found for this or any previous visit.  Lumbosacral Imaging: Lumbar MR wo contrast: No results found for this or any previous visit. Lumbar MR wo contrast: No procedure found. Lumbar MR w/wo  contrast: No results found for this or any previous visit. Lumbar MR w/wo contrast: No results found for this or any previous visit. Lumbar MR w contrast: No results found for this or any previous visit. Lumbar CT wo contrast: No results found for this or any previous visit. Lumbar CT w/wo contrast: No results found for this or any previous visit. Lumbar CT w/wo contrast: No results found for this or any previous visit. Lumbar CT w contrast: No results found for this or any previous visit. Lumbar DG 1V: No results found for this or any previous visit. Lumbar DG 1V (Clearing): No results found for this or any previous visit. Lumbar DG 2-3V (Clearing): No results found for this or any previous visit. Lumbar DG 2-3 views: No results found for  this or any previous visit. Lumbar DG (Complete) 4+V:  Results for orders placed during the hospital encounter of 12/30/17  DG Lumbar Spine Complete   Narrative CLINICAL DATA:  Lower back pain after fall down stairs 3 weeks ago.  EXAM: LUMBAR SPINE - COMPLETE 4+ VIEW  COMPARISON:  MRI of April 29, 2013.  FINDINGS: No fracture or spondylolisthesis is noted. Moderate degenerative disc disease is noted at L4-5. Remaining disc spaces are unremarkable.  IMPRESSION: Moderate degenerative disc disease is noted at L4-5. No acute abnormality seen in the lumbar spine.   Electronically Signed   By: Marijo Conception, M.D.   On: 12/30/2017 09:45    Lumbar DG F/E views: No results found for this or any previous visit. Lumbar DG Bending views: No results found for this or any previous visit. Lumbar DG Myelogram views: No results found for this or any previous visit. Lumbar DG Myelogram: No results found for this or any previous visit. Lumbar DG Myelogram: No results found for this or any previous visit. Lumbar DG Myelogram: No results found for this or any previous visit. Lumbar DG Myelogram Lumbosacral: No results found for this or any previous  visit. Lumbar DG Diskogram views: No results found for this or any previous visit. Lumbar DG Diskogram views: No results found for this or any previous visit. Lumbar DG Epidurogram OP: No results found for this or any previous visit. Lumbar DG Epidurogram IP: No results found for this or any previous visit.  Sacroiliac Joint Imaging: Sacroiliac Joint DG: No results found for this or any previous visit. Sacroiliac Joint MR w/wo contrast: No results found for this or any previous visit. Sacroiliac Joint MR wo contrast: No results found for this or any previous visit.  Spine Imaging: Whole Spine DG Myelogram views: No results found for this or any previous visit. Whole Spine MR Mets screen: No results found for this or any previous visit. Whole Spine MR Mets screen: No results found for this or any previous visit. Whole Spine MR w/wo: No results found for this or any previous visit. MRA Spinal Canal w/ cm: No results found for this or any previous visit. MRA Spinal Canal wo/ cm: No procedure found. MRA Spinal Canal w/wo cm: No results found for this or any previous visit. Spine Outside MR Films: No results found for this or any previous visit. Spine Outside CT Films: No results found for this or any previous visit. CT-Guided Biopsy: No results found for this or any previous visit. CT-Guided Needle Placement: No results found for this or any previous visit. DG Spine outside: No results found for this or any previous visit. IR Spine outside: No results found for this or any previous visit. NM Spine outside: No results found for this or any previous visit.  Hip Imaging: Hip-R MR w contrast: No results found for this or any previous visit. Hip-L MR w contrast: No results found for this or any previous visit. Hip-R MR w/wo contrast: No results found for this or any previous visit. Hip-L MR w/wo contrast: No results found for this or any previous visit. Hip-R MR wo contrast: No results found  for this or any previous visit. Hip-L MR wo contrast: No results found for this or any previous visit. Hip-R CT w contrast: No results found for this or any previous visit. Hip-L CT w contrast: No results found for this or any previous visit. Hip-R CT w/wo contrast: No results found for this or any previous  visit. Hip-L CT w/wo contrast: No results found for this or any previous visit. Hip-R CT wo contrast: No results found for this or any previous visit. Hip-L CT wo contrast: No results found for this or any previous visit. Hip-R DG 2-3 views: No results found for this or any previous visit. Hip-L DG 2-3 views: No results found for this or any previous visit. Hip-R DG Arthrogram: No results found for this or any previous visit. Hip-L DG Arthrogram: No results found for this or any previous visit. Hip-B DG Bilateral: No results found for this or any previous visit.  Knee Imaging: Knee-R MR w contrast: No results found for this or any previous visit. Knee-L MR w contrast: No results found for this or any previous visit. Knee-R MR w/wo contrast: No results found for this or any previous visit. Knee-L MR w/wo contrast: No results found for this or any previous visit. Knee-R MR wo contrast: No results found for this or any previous visit. Knee-L MR wo contrast: No results found for this or any previous visit. Knee-R CT w contrast: No results found for this or any previous visit. Knee-L CT w contrast: No results found for this or any previous visit. Knee-R CT w/wo contrast: No results found for this or any previous visit. Knee-L CT w/wo contrast: No results found for this or any previous visit. Knee-R CT wo contrast: No results found for this or any previous visit. Knee-L CT wo contrast: No results found for this or any previous visit. Knee-R DG 1-2 views: No results found for this or any previous visit. Knee-L DG 1-2 views: No results found for this or any previous visit. Knee-R DG 3 views:  No results found for this or any previous visit. Knee-L DG 3 views: No results found for this or any previous visit. Knee-R DG 4 views: No results found for this or any previous visit. Knee-L DG 4 views: No results found for this or any previous visit. Knee-R DG Arthrogram: No results found for this or any previous visit. Knee-L DG Arthrogram: No results found for this or any previous visit.  Ankle Imaging: Ankle-R DG Complete: No results found for this or any previous visit. Ankle-L DG Complete: No results found for this or any previous visit.  Foot Imaging: Foot-R DG Complete: No results found for this or any previous visit. Foot-L DG Complete: No results found for this or any previous visit.  Elbow Imaging: Elbow-R DG Complete: No results found for this or any previous visit. Elbow-L DG Complete: No results found for this or any previous visit.  Wrist Imaging: Wrist-R DG Complete: No results found for this or any previous visit. Wrist-L DG Complete: No results found for this or any previous visit.  Hand Imaging: Hand-R DG Complete: No results found for this or any previous visit. Hand-L DG Complete: No results found for this or any previous visit.  Complexity Note: Imaging results reviewed. Results shared with Kaitlyn Harvey, using Layman's terms.                         ROS  Cardiovascular: {Hx; Cardiovascular History:210120525} Pulmonary or Respiratory: {Hx; Pumonary and/or Respiratory History:210120523} Neurological: {Hx; Neurological:210120504} Review of Past Neurological Studies: No results found for this or any previous visit. Psychological-Psychiatric: {Hx; Psychological-Psychiatric History:210120512} Gastrointestinal: {Hx; Gastrointestinal:210120527} Genitourinary: {Hx; Genitourinary:210120506} Hematological: {Hx; Hematological:210120510} Endocrine: {Hx; Endocrine history:210120509} Rheumatologic: {Hx; Rheumatological:210120530} Musculoskeletal: {Hx;  Musculoskeletal:210120528} Work History: {Hx; Work history:210120514}  Allergies  Kaitlyn Harvey has No Known Allergies.  Laboratory Chemistry  Inflammation Markers (CRP: Acute Phase) (ESR: Chronic Phase) No results found for: CRP, ESRSEDRATE, LATICACIDVEN                       Rheumatology Markers No results found for: RF, ANA, LABURIC, URICUR, LYMEIGGIGMAB, LYMEABIGMQN, HLAB27                      Renal Function Markers Lab Results  Component Value Date   BUN 15 04/09/2017   CREATININE 0.79 87/68/1157   BCR NOT APPLICABLE 26/20/3559   GFRAA 106 04/09/2017   GFRNONAA 91 04/09/2017                             Hepatic Function Markers Lab Results  Component Value Date   AST 28 04/09/2017   ALT 14 04/09/2017   ALBUMIN 4.6 03/05/2016   ALKPHOS 36 03/05/2016                        Electrolytes Lab Results  Component Value Date   NA 138 04/09/2017   K 4.2 04/09/2017   CL 103 04/09/2017   CALCIUM 9.6 04/09/2017                        Neuropathy Markers Lab Results  Component Value Date   HIV NON-REACTIVE 04/09/2017                        CNS Tests No results found for: COLORCSF, APPEARCSF, RBCCOUNTCSF, WBCCSF, POLYSCSF, LYMPHSCSF, EOSCSF, PROTEINCSF, GLUCCSF, JCVIRUS, CSFOLI, IGGCSF                      Bone Pathology Markers Lab Results  Component Value Date   VD25OH 32 03/05/2016                         Coagulation Parameters Lab Results  Component Value Date   PLT 284 04/09/2017                        Cardiovascular Markers Lab Results  Component Value Date   HGB 13.2 04/09/2017   HCT 38.7 04/09/2017                         CA Markers No results found for: CEA, CA125, LABCA2                      Endocrine Markers Lab Results  Component Value Date   TSH 3.71 04/09/2017                        Note: Lab results reviewed.  PFSH  Drug: Kaitlyn Harvey  reports no history of drug use. Alcohol:  reports current alcohol use of about 2.0 standard drinks of  alcohol per week. Tobacco:  reports that she quit smoking about 2 years ago. Her smoking use included cigarettes. She started smoking about 23 years ago. She has a 10.00 pack-year smoking history. She has never used smokeless tobacco. Medical:  has a past medical history of Arthritis and Thoracic compression fracture, sequela (12/24/2017). Family: family history includes Asthma in her mother; Bell's palsy in her brother;  Heart attack (age of onset: 49) in her father; Heart murmur in her brother; Hypertension in her brother and mother; Other in her mother; Thyroid disease in her mother.  Past Surgical History:  Procedure Laterality Date  . UPPER GASTROINTESTINAL ENDOSCOPY    . UPPER GI ENDOSCOPY     Active Ambulatory Problems    Diagnosis Date Noted  . Well woman exam without gynecological exam 02/28/2015  . Tobacco abuse 02/28/2015  . Screening for breast cancer 02/28/2015  . DDD (degenerative disc disease), lumbar 08/09/2015  . Facet syndrome, lumbar 08/09/2015  . 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 Antibiotic-induced yeast infection 01/31/2016  . Murmur 05/09/2016  . Palpitations 05/09/2016  . Thoracic compression fracture, sequela 12/24/2017  . Hx of tobacco use, presenting hazards to health 12/24/2017   Resolved Ambulatory Problems    Diagnosis Date Noted  . Acute cystitis 01/31/2016  . PAD (peripheral artery disease) ([place]) 05/13/2017   Past Medical History:  Diagnosis Date  . Arthritis    Constitutional Exam  General appearance: Well nourished, well developed, and well hydrated. In no apparent acute distress Vitals:   02/11/18 0805  BP: 118/83  Pulse: 76  Resp: 16  Temp: 98.5 F (36.9 C)  TempSrc: Oral  SpO2: 97%  Weight: 138 lb 12.8 oz (63 kg)  Height: 5' 5"  (1.651 m)   BMI Assessment: Estimated body mass index is 23.1 kg/m as calculated from the following:   Height as of this encounter: 5' 5"  (1.651 m).   Weight as of this encounter: 138 lb 12.8 oz (63  kg).  BMI interpretation table: BMI level Category Range association with higher incidence of chronic pain  <18 kg/m2 Underweight   18.5-24.9 kg/m2 Ideal body weight   25-29.9 kg/m2 Overweight Increased incidence by 20%  30-34.9 kg/m2 Obese (Class I) Increased incidence by 68%  35-39.9 kg/m2 Severe obesity (Class II) Increased incidence by 136%  >40 kg/m2 Extreme obesity (Class III) Increased incidence by 254%   Patient's current BMI Ideal Body weight  Body mass index is 23.1 kg/m. Ideal body weight: 57 kg (125 lb 10.6 oz) Adjusted ideal body weight: 59.4 kg (130 lb 14.7 oz)   BMI Readings from Last 4 Encounters:  02/11/18 23.10 kg/m  12/24/17 23.00 kg/m  05/13/17 22.33 kg/m  04/09/17 22.24 kg/m   Wt Readings from Last 4 Encounters:  02/11/18 138 lb 12.8 oz (63 kg)  12/24/17 138 lb 3.2 oz (62.7 kg)  05/13/17 134 lb 3.2 oz (60.9 kg)  04/09/17 132 lb 9.6 oz (60.1 kg)  Psych/Mental status: Alert, oriented x 3 (person, place, & time)       Eyes: PERLA Respiratory: No evidence of acute respiratory distress  Cervical Spine Area Exam  Skin & Axial Inspection: No masses, redness, edema, swelling, or associated skin lesions Alignment: Symmetrical Functional ROM: Unrestricted ROM      Stability: No instability detected Muscle Tone/Strength: Functionally intact. No obvious neuro-muscular anomalies detected. Sensory (Neurological): Unimpaired Palpation: No palpable anomalies              Upper Extremity (UE) Exam    Side: Right upper extremity  Side: Left upper extremity  Skin & Extremity Inspection: Skin color, temperature, and hair growth are WNL. No peripheral edema or cyanosis. No masses, redness, swelling, asymmetry, or associated skin lesions. No contractures.  Skin & Extremity Inspection: Skin color, temperature, and hair growth are WNL. No peripheral edema or cyanosis. No masses, redness, swelling, asymmetry, or associated skin lesions. No contractures.  Functional ROM:  Unrestricted ROM          Functional ROM: Unrestricted ROM          Muscle Tone/Strength: Functionally intact. No obvious neuro-muscular anomalies detected.  Muscle Tone/Strength: Functionally intact. No obvious neuro-muscular anomalies detected.  Sensory (Neurological): Unimpaired          Sensory (Neurological): Unimpaired          Palpation: No palpable anomalies              Palpation: No palpable anomalies              Provocative Test(s):  Phalen's test: deferred Tinel's test: deferred Apley's scratch test (touch opposite shoulder):  Action 1 (Across chest): deferred Action 2 (Overhead): deferred Action 3 (LB reach): deferred   Provocative Test(s):  Phalen's test: deferred Tinel's test: deferred Apley's scratch test (touch opposite shoulder):  Action 1 (Across chest): deferred Action 2 (Overhead): deferred Action 3 (LB reach): deferred    Thoracic Spine Area Exam  Skin & Axial Inspection: No masses, redness, or swelling Alignment: Symmetrical Functional ROM: Unrestricted ROM Stability: No instability detected Muscle Tone/Strength: Functionally intact. No obvious neuro-muscular anomalies detected. Sensory (Neurological): Unimpaired Muscle strength & Tone: No palpable anomalies  Lumbar Spine Area Exam  Skin & Axial Inspection: No masses, redness, or swelling Alignment: Symmetrical Functional ROM: Unrestricted ROM       Stability: No instability detected Muscle Tone/Strength: Functionally intact. No obvious neuro-muscular anomalies detected. Sensory (Neurological): Unimpaired Palpation: No palpable anomalies       Provocative Tests: Hyperextension/rotation test: deferred today       Lumbar quadrant test (Kemp's test): deferred today       Lateral bending test: deferred today       Patrick's Maneuver: deferred today                   FABER* test: deferred today                   S-I anterior distraction/compression test: deferred today         S-I lateral compression  test: deferred today         S-I Thigh-thrust test: deferred today         S-I Gaenslen's test: deferred today         *(Flexion, ABduction and External Rotation)  Gait & Posture Assessment  Ambulation: Unassisted Gait: Relatively normal for age and body habitus Posture: WNL   Lower Extremity Exam    Side: Right lower extremity  Side: Left lower extremity  Stability: No instability observed          Stability: No instability observed          Skin & Extremity Inspection: Skin color, temperature, and hair growth are WNL. No peripheral edema or cyanosis. No masses, redness, swelling, asymmetry, or associated skin lesions. No contractures.  Skin & Extremity Inspection: Skin color, temperature, and hair growth are WNL. No peripheral edema or cyanosis. No masses, redness, swelling, asymmetry, or associated skin lesions. No contractures.  Functional ROM: Unrestricted ROM                  Functional ROM: Unrestricted ROM                  Muscle Tone/Strength: Functionally intact. No obvious neuro-muscular anomalies detected.  Muscle Tone/Strength: Functionally intact. No obvious neuro-muscular anomalies detected.  Sensory (Neurological): Unimpaired        Sensory (Neurological):  Unimpaired        DTR: Patellar: deferred today Achilles: deferred today Plantar: deferred today  DTR: Patellar: deferred today Achilles: deferred today Plantar: deferred today  Palpation: No palpable anomalies  Palpation: No palpable anomalies   Assessment  Primary Diagnosis & Pertinent Problem List: There were no encounter diagnoses.  Visit Diagnosis (New problems to examiner): No diagnosis found. Plan of Care (Initial workup plan)  Note: Please be advised that as per protocol, today's visit has been an evaluation only. We have not taken over the patient's controlled substance management.  Problem-specific plan: No problem-specific Assessment & Plan notes found for this encounter.  Ordered Lab-work,  Procedure(s), Referral(s), & Consult(s): No orders of the defined types were placed in this encounter.  Pharmacotherapy (current): Medications ordered:  No orders of the defined types were placed in this encounter.  Medications administered during this visit: Kaitlyn Harvey had no medications administered during this visit.   Pharmacological management options:  Opioid Analgesics: The patient was informed that there is no guarantee that she would be a candidate for opioid analgesics. The decision will be made following CDC guidelines. This decision will be based on the results of diagnostic studies, as well as Kaitlyn Harvey's risk profile.   Membrane stabilizer: To be determined at a later time  Muscle relaxant: To be determined at a later time  NSAID: To be determined at a later time  Other analgesic(s): To be determined at a later time   Interventional management options: Kaitlyn Harvey was informed that there is no guarantee that she would be a candidate for interventional therapies. The decision will be based on the results of diagnostic studies, as well as Kaitlyn Harvey's risk profile.  Procedure(s) under consideration:  ***   Provider-requested follow-up: Return for Medication Management.  Future Appointments  Date Time Provider [place]  04/29/2018  8:00 AM Lada, Satira Anis, MD [place] [facility]    Primary Care Physician: Arnetha Courser, MD Location: [facility] Note by: Gillis Santa, M.D, Date: 02/11/2018; Time: 8:28 AM  There are no Patient Instructions on file for this visit.

## 2018-02-11 NOTE — Patient Instructions (Signed)
Stop all NSAIDs while on Diclofenac, Ok to take Tylenol

## 2018-02-14 LAB — COMPLIANCE DRUG ANALYSIS, UR

## 2018-02-25 ENCOUNTER — Encounter: Payer: Self-pay | Admitting: Student in an Organized Health Care Education/Training Program

## 2018-02-25 ENCOUNTER — Ambulatory Visit
Payer: BC Managed Care – PPO | Attending: Student in an Organized Health Care Education/Training Program | Admitting: Student in an Organized Health Care Education/Training Program

## 2018-02-25 ENCOUNTER — Other Ambulatory Visit: Payer: Self-pay

## 2018-02-25 VITALS — BP 115/86 | HR 69 | Temp 98.0°F | Resp 16 | Ht 65.0 in | Wt 138.0 lb

## 2018-02-25 DIAGNOSIS — G8929 Other chronic pain: Secondary | ICD-10-CM

## 2018-02-25 DIAGNOSIS — M7918 Myalgia, other site: Secondary | ICD-10-CM | POA: Diagnosis present

## 2018-02-25 DIAGNOSIS — G894 Chronic pain syndrome: Secondary | ICD-10-CM

## 2018-02-25 DIAGNOSIS — S22070S Wedge compression fracture of T9-T10 vertebra, sequela: Secondary | ICD-10-CM | POA: Diagnosis present

## 2018-02-25 DIAGNOSIS — M545 Low back pain, unspecified: Secondary | ICD-10-CM

## 2018-02-25 DIAGNOSIS — M5136 Other intervertebral disc degeneration, lumbar region: Secondary | ICD-10-CM | POA: Insufficient documentation

## 2018-02-25 DIAGNOSIS — M51369 Other intervertebral disc degeneration, lumbar region without mention of lumbar back pain or lower extremity pain: Secondary | ICD-10-CM

## 2018-02-25 DIAGNOSIS — M533 Sacrococcygeal disorders, not elsewhere classified: Secondary | ICD-10-CM | POA: Diagnosis not present

## 2018-02-25 DIAGNOSIS — M47816 Spondylosis without myelopathy or radiculopathy, lumbar region: Secondary | ICD-10-CM | POA: Diagnosis present

## 2018-02-25 MED ORDER — DICLOFENAC SODIUM 75 MG PO TBEC: 75.0000 mg | DELAYED_RELEASE_TABLET | Freq: Two times a day (BID) | ORAL | 3 refills | Status: DC

## 2018-02-25 NOTE — Progress Notes (Signed)
Safety precautions to be maintained throughout the outpatient stay will include: orient to surroundings, keep bed in low position, maintain call bell within reach at all times, provide assistance with transfer out of bed and ambulation.  

## 2018-02-25 NOTE — Progress Notes (Signed)
Patient's Name: Kaitlyn Harvey  MRN: [id number]  Referring Provider: Arnetha Courser, MD  DOB: 04-17-1975  PCP: Arnetha Courser, MD  DOS: 02/25/2018  Note by: Gillis Santa, MD  Service setting: Ambulatory outpatient  Specialty: Interventional Pain Management  Location: [facility] (AMB) Pain Management Facility    Patient type: Established   HPI  Reason for Visit: Kaitlyn Harvey is a 43 y.o. year old, female patient, who comes today with a chief complaint of Back Pain (lower) Last Appointment: Her last appointment at our practice was on 02/11/2018. I last saw her on 02/11/2018.  Pain Assessment: Today, Kaitlyn Harvey describes the severity of the Chronic pain as a 0-No pain(diclofenac and Tylenol this am)/10. She indicates the location/referral of the pain to be Back Lower, Right, Left/denies. Onset was: More than a month ago. The quality of pain is described as (when in pain; aching sore and sharp). Temporal description, or timing of pain is: Constant(without meds, constant pain. sometimes worse than others). Possible modifying factors: diclofenac, tylenol. Kaitlyn Harvey's  height is 5' 5"  (1.651 m) and weight is 138 lb (62.6 kg). Her oral temperature is 98 F (36.7 C). Her blood pressure is 115/86 and her pulse is 69. Her respiration is 16 and oxygen saturation is 100%.   Patient follows up today for second patient visit.  She continues to endorse axial low back pain bilaterally.  Reviewed SI joint x-ray which was largely unremarkable.  Patient is finding benefit with diclofenac that she is taking 75 mg twice daily.  No side effects noted.  See treatment plan below.  ROS  Constitutional: Denies any fever or chills Gastrointestinal: No reported hemesis, hematochezia, vomiting, or acute GI distress Musculoskeletal: Denies any acute onset joint swelling, redness, loss of ROM, or weakness Neurological: No reported episodes of acute onset apraxia, aphasia, dysarthria, agnosia, amnesia, paralysis, loss of coordination, or  loss of consciousness  Medication Review  Biotin, acetaminophen, calcium-vitamin D, diclofenac, ibuprofen, and multivitamin  History Review  Allergy: Kaitlyn Harvey has No Known Allergies. Drug: Kaitlyn Harvey  reports no history of drug use. Alcohol:  reports current alcohol use of about 2.0 standard drinks of alcohol per week. Tobacco:  reports that she quit smoking about 2 years ago. Her smoking use included cigarettes. She started smoking about 23 years ago. She has a 10.00 pack-year smoking history. She has never used smokeless tobacco. Social: Kaitlyn Harvey  reports that she quit smoking about 2 years ago. Her smoking use included cigarettes. She started smoking about 23 years ago. She has a 10.00 pack-year smoking history. She has never used smokeless tobacco. She reports current alcohol use of about 2.0 standard drinks of alcohol per week. She reports that she does not use drugs. Medical:  has a past medical history of Arthritis and Thoracic compression fracture, sequela (12/24/2017). Surgical: Kaitlyn Harvey  has a past surgical history that includes Upper gi endoscopy and Upper gastrointestinal endoscopy. Family: family history includes Asthma in her mother; Bell's palsy in her brother; Heart attack (age of onset: 38) in her father; Heart murmur in her brother; Hypertension in her brother and mother; Other in her mother; Thyroid disease in her mother. Problem List: Kaitlyn Harvey does not have any pertinent problems on file.  Lab Review  Kidney Function Lab Results  Component Value Date   BUN 15 04/09/2017   CREATININE 0.79 27/78/2423   BCR NOT APPLICABLE 53/61/4431   GFRAA 106 04/09/2017   GFRNONAA 91 04/09/2017  Liver Function Lab Results  Component Value Date   AST 28 04/09/2017   ALT 14 04/09/2017   ALBUMIN 4.6 03/05/2016  Note: Above Lab results reviewed.  Imaging Review  DG Si Joints CLINICAL DATA:  Chronic BILATERAL sacroiliac pain for many years. No known injuries.  EXAM: BILATERAL  SACROILIAC JOINTS - 3+ VIEW  COMPARISON:  Visualized sacroiliac joints on lumbar spine x-rays 12/30/2017, 03/31/2013 and lumbar spine MRI 04/29/2013.  FINDINGS: Well preserved joint spaces in both sacroiliac joints without significant degenerative change. No intrinsic osseous abnormality involving the sacrum or the visualized bony pelvis. Symphysis pubis intact without degenerative change.  IMPRESSION: Normal examination.  Electronically Signed   By: Evangeline Dakin M.D.   On: 02/11/2018 14:00 Note: Reviewed        Physical Exam  General appearance: Well nourished, well developed, and well hydrated. In no apparent acute distress Mental status: Alert, oriented x 3 (person, place, & time)       Respiratory: No evidence of acute respiratory distress Eyes: PERLA Vitals: BP 115/86   Pulse 69   Temp 98 F (36.7 C) (Oral)   Resp 16   Ht 5' 5"  (1.651 m)   Wt 138 lb (62.6 kg)   LMP 02/01/2018 (Exact Date)   SpO2 100%   BMI 22.96 kg/m  BMI: Estimated body mass index is 22.96 kg/m as calculated from the following:   Height as of this encounter: 5' 5"  (1.651 m).   Weight as of this encounter: 138 lb (62.6 kg). Ideal: Ideal body weight: 57 kg (125 lb 10.6 oz) Adjusted ideal body weight: 59.2 kg (130 lb 9.6 oz)  Thoracic Spine Area Exam  Skin & Axial Inspection: No masses, redness, or swelling Alignment: Symmetrical Functional ROM: Unrestricted ROM Stability: No instability detected Muscle Tone/Strength: Functionally intact. No obvious neuro-muscular anomalies detected. Sensory (Neurological): Unimpaired Muscle strength & Tone: No palpable anomalies Lumbar Spine Area Exam  Skin & Axial Inspection: No masses, redness, or swelling Alignment: Symmetrical Functional ROM: Unrestricted ROM       Stability: No instability detected Muscle Tone/Strength: Functionally intact. No obvious neuro-muscular anomalies detected. Sensory (Neurological): Articular pain pattern and  musculoskeletal  Provocative Tests: Hyperextension/rotation test: (+) bilaterally for facet joint pain. Lumbar quadrant test (Kemp's test): (+) bilaterally for facet joint pain. Lateral bending test: (+) due to pain. Patrick's Maneuver: deferred today                   FABER* test: deferred today                   S-I anterior distraction/compression test: deferred today         S-I lateral compression test: deferred today         S-I Thigh-thrust test: deferred today         S-I Gaenslen's test: deferred today         *(Flexion, ABduction and External Rotation) Gait & Posture Assessment  Ambulation: Unassisted Gait: Relatively normal for age and body habitus Posture: WNL  Lower Extremity Exam    Side: Right lower extremity  Side: Left lower extremity  Stability: No instability observed          Stability: No instability observed          Skin & Extremity Inspection: Skin color, temperature, and hair growth are WNL. No peripheral edema or cyanosis. No masses, redness, swelling, asymmetry, or associated skin lesions. No contractures.  Skin & Extremity Inspection: Skin color, temperature, and hair growth are WNL.  No peripheral edema or cyanosis. No masses, redness, swelling, asymmetry, or associated skin lesions. No contractures.  Functional ROM: Unrestricted ROM                  Functional ROM: Unrestricted ROM                  Muscle Tone/Strength: Functionally intact. No obvious neuro-muscular anomalies detected.  Muscle Tone/Strength: Functionally intact. No obvious neuro-muscular anomalies detected.  Sensory (Neurological): Unimpaired        Sensory (Neurological): Unimpaired        DTR: Patellar: deferred today Achilles: deferred today Plantar: deferred today  DTR: Patellar: deferred today Achilles: deferred today Plantar: deferred today  Palpation: No palpable anomalies  Palpation: No palpable anomalies    Assessment   Status Diagnosis  Having a Flare-up Having a  Flare-up Controlled 1. Lumbar facet arthropathy   2. Lumbar spondylosis   3. Chronic bilateral low back pain without sciatica   4. Lumbar degenerative disc disease (modeate L4/L5)   5. Chronic SI joint pain   6. Compression fracture of T9 vertebra, sequela   7. Musculoskeletal pain   8. Chronic pain syndrome      Updated Problems: Problem  Lumbar Facet Arthropathy  Lumbar Spondylosis  Musculoskeletal Pain  Chronic Pain Syndrome  Compression Fracture of T9 Vertebra ([facility])   March 31, 2013   Lumbar Degenerative Disc Disease   Kaitlyn Harvey has a history of greater than 3 months of moderate to severe pain which is resulted in functional impairment.  The patient has tried various conservative therapeutic options such as NSAIDs, Tylenol, muscle relaxants, physical therapy which was inadequately effective.  Patient's pain is predominantly axial with physical exam findings suggestive of facet arthropathy.  Lumbar facet medial branch nerve blocks were discussed with the patient.  Risks and benefits were reviewed.  Patient would like to proceed with bilateral L3, L4, L5 medial branch nerve block.  Plan of Care  Pharmacotherapy (Medications Ordered): Meds ordered this encounter  Medications  . diclofenac (VOLTAREN) 75 MG EC tablet    Sig: Take 1 tablet (75 mg total) by mouth 2 (two) times daily.    Dispense:  60 tablet    Refill:  3   Orders:  Orders Placed This Encounter  Procedures  . LUMBAR FACET(MEDIAL BRANCH NERVE BLOCK) MBNB    Standing Status:   Future    Standing Expiration Date:   03/26/2018    Scheduling Instructions:     Side: Bilateral     Level: L3, L4, L5  ( L3, L4, L5, Medial Branch Nerves)     Sedation: With Sedation.     Timeframe: ASAP    Order Specific Question:   Where will this procedure be performed?    Answer:   [facility]   Interventional options: Planned follow-up:   Diagnostic bilateral L3, L4, L5 facet medial branch nerve block Plan: Return  in about 2 weeks (around 03/11/2018) for Procedure.    Time Note: Greater than 50% of the 25 minute(s) of face-to-face time spent with Kaitlyn Harvey, was spent in counseling/coordination of care regarding: Kaitlyn Harvey primary cause of pain, the results of her recent test(s), the significance of each one oth the test(s) anomalies and it's corresponding characteristic pain pattern(s), the treatment plan, treatment alternatives, the risks and possible complications of proposed treatment, medication side effects, realistic expectations and the goals of pain management (increased in functionality).   Note by: Gillis Santa, MD Date: 02/25/2018;  Time: 4:03 PM

## 2018-02-25 NOTE — Patient Instructions (Signed)
Moderate Conscious Sedation, Adult Sedation is the use of medicines to promote relaxation and relieve discomfort and anxiety. Moderate conscious sedation is a type of sedation. Under moderate conscious sedation, you are less alert than normal, but you are still able to respond to instructions, touch, or both. Moderate conscious sedation is used during short medical and dental procedures. It is milder than deep sedation, which is a type of sedation under which you cannot be easily woken up. It is also milder than general anesthesia, which is the use of medicines to make you unconscious. Moderate conscious sedation allows you to return to your regular activities sooner. Tell a health care provider about:  Any allergies you have.  All medicines you are taking, including vitamins, herbs, eye drops, creams, and over-the-counter medicines.  Use of steroids (by mouth or creams).  Any problems you or family members have had with sedatives and anesthetic medicines.  Any blood disorders you have.  Any surgeries you have had.  Any medical conditions you have, such as sleep apnea.  Whether you are pregnant or may be pregnant.  Any use of cigarettes, alcohol, marijuana, or street drugs. What are the risks? Generally, this is a safe procedure. However, problems may occur, including:  Getting too much medicine (oversedation).  Nausea.  Allergic reaction to medicines.  Trouble breathing. If this happens, a breathing tube may be used to help with breathing. It will be removed when you are awake and breathing on your own.  Heart trouble.  Lung trouble. What happens before the procedure? Staying hydrated Follow instructions from your health care provider about hydration, which may include:  Up to 2 hours before the procedure - you may continue to drink clear liquids, such as water, clear fruit juice, black coffee, and plain tea. Eating and drinking restrictions Follow instructions from your  health care provider about eating and drinking, which may include:  8 hours before the procedure - stop eating heavy meals or foods such as meat, fried foods, or fatty foods.  6 hours before the procedure - stop eating light meals or foods, such as toast or cereal.  6 hours before the procedure - stop drinking milk or drinks that contain milk.  2 hours before the procedure - stop drinking clear liquids. Medicine Ask your health care provider about:  Changing or stopping your regular medicines. This is especially important if you are taking diabetes medicines or blood thinners.  Taking medicines such as aspirin and ibuprofen. These medicines can thin your blood. Do not take these medicines before your procedure if your health care provider instructs you not to.  Tests and exams  You will have a physical exam.  You may have blood tests done to show: ? How well your kidneys and liver are working. ? How well your blood can clot. General instructions  Plan to have someone take you home from the hospital or clinic.  If you will be going home right after the procedure, plan to have someone with you for 24 hours. What happens during the procedure?  An IV tube will be inserted into one of your veins.  Medicine to help you relax (sedative) will be given through the IV tube.  The medical or dental procedure will be performed. What happens after the procedure?  Your blood pressure, heart rate, breathing rate, and blood oxygen level will be monitored often until the medicines you were given have worn off.  Do not drive for 24 hours. This information is not  intended to replace advice given to you by your health care provider. Make sure you discuss any questions you have with your health care provider. Document Released: 09/12/2000 Document Revised: 05/24/2015 Document Reviewed: 04/09/2015 Elsevier Interactive Patient Education  2019 [place]  What are the risk, side effects and possible complications? Generally speaking, most procedures are safe.  However, with any procedure there are risks, side effects, and the possibility of complications.  The risks and complications are dependent upon the sites that are lesioned, or the type of nerve block to be performed.  The closer the procedure is to the spine, the more serious the risks are.  Great care is taken when placing the radio frequency needles, block needles or lesioning probes, but sometimes complications can occur. 1. Infection: Any time there is an injection through the skin, there is a risk of infection.  This is why sterile conditions are used for these blocks.  There are four possible types of infection. 1. Localized skin infection. 2. Central Nervous System Infection-This can be in the form of Meningitis, which can be deadly. 3. Epidural Infections-This can be in the form of an epidural abscess, which can cause pressure inside of the spine, causing compression of the spinal cord with subsequent paralysis. This would require an emergency surgery to decompress, and there are no guarantees that the patient would recover from the paralysis. 4. Discitis-This is an infection of the intervertebral discs.  It occurs in about 1% of discography procedures.  It is difficult to treat and it may lead to surgery.        2. Pain: the needles have to go through skin and soft tissues, will cause soreness.       3. Damage to internal structures:  The nerves to be lesioned may be near blood vessels or    other nerves which can be potentially damaged.       4. Bleeding: Bleeding is more common if the patient is taking blood thinners such as  aspirin, Coumadin, Ticiid, Plavix, etc., or if he/she have some genetic predisposition  such as hemophilia. Bleeding into the spinal canal can cause compression of the spinal  cord with subsequent paralysis.  This would require an emergency surgery  to  decompress and there are no guarantees that the patient would recover from the  paralysis.       5. Pneumothorax:  Puncturing of a lung is a possibility, every time a needle is introduced in  the area of the chest or upper back.  Pneumothorax refers to free air around the  collapsed lung(s), inside of the thoracic cavity (chest cavity).  Another two possible  complications related to a similar event would include: Hemothorax and Chylothorax.   These are variations of the Pneumothorax, where instead of air around the collapsed  lung(s), you may have blood or chyle, respectively.       6. Spinal headaches: They may occur with any procedures in the area of the spine.       7. Persistent CSF (Cerebro-Spinal Fluid) leakage: This is a rare problem, but may occur  with prolonged intrathecal or epidural catheters either due to the formation of a fistulous  track or a dural tear.       8. Nerve damage: By working so close to the spinal cord, there is always a possibility of  nerve damage, which could be as serious as a permanent spinal cord injury with  paralysis.  9. Death:  Although rare, severe deadly allergic reactions known as "Anaphylactic  reaction" can occur to any of the medications used.      10. Worsening of the symptoms:  We can always make thing worse.  What are the chances of something like this happening? Chances of any of this occuring are extremely low.  By statistics, you have more of a chance of getting killed in a motor vehicle accident: while driving to the hospital than any of the above occurring .  Nevertheless, you should be aware that they are possibilities.  In general, it is similar to taking a shower.  Everybody knows that you can slip, hit your head and get killed.  Does that mean that you should not shower again?  Nevertheless always keep in mind that statistics do not mean anything if you happen to be on the wrong side of them.  Even if a procedure has a 1 (one) in a 1,000,000  (million) chance of going wrong, it you happen to be that one..Also, keep in mind that by statistics, you have more of a chance of having something go wrong when taking medications.  Who should not have this procedure? If you are on a blood thinning medication (e.g. Coumadin, Plavix, see list of "Blood Thinners"), or if you have an active infection going on, you should not have the procedure.  If you are taking any blood thinners, please inform your physician.  How should I prepare for this procedure?  Do not eat or drink anything at least six hours prior to the procedure.  Bring a driver with you .  It cannot be a taxi.  Come accompanied by an adult that can drive you back, and that is strong enough to help you if your legs get weak or numb from the local anesthetic.  Take all of your medicines the morning of the procedure with just enough water to swallow them.  If you have diabetes, make sure that you are scheduled to have your procedure done first thing in the morning, whenever possible.  If you have diabetes, take only half of your insulin dose and notify our nurse that you have done so as soon as you arrive at the clinic.  If you are diabetic, but only take blood sugar pills (oral hypoglycemic), then do not take them on the morning of your procedure.  You may take them after you have had the procedure.  Do not take aspirin or any aspirin-containing medications, at least eleven (11) days prior to the procedure.  They may prolong bleeding.  Wear loose fitting clothing that may be easy to take off and that you would not mind if it got stained with Betadine or blood.  Do not wear any jewelry or perfume  Remove any nail coloring.  It will interfere with some of our monitoring equipment.  NOTE: Remember that this is not meant to be interpreted as a complete list of all possible complications.  Unforeseen problems may occur.  BLOOD THINNERS The following drugs contain aspirin or other  products, which can cause increased bleeding during surgery and should not be taken for 2 weeks prior to and 1 week after surgery.  If you should need take something for relief of minor pain, you may take acetaminophen which is found in Tylenol,m Datril, Anacin-3 and Panadol. It is not blood thinner. The products listed below are.  Do not take any of the products listed below in addition to any listed on  your instruction sheet.  A.P.C or A.P.C with Codeine Codeine Phosphate Capsules #3 Ibuprofen Ridaura  ABC compound Congesprin Imuran rimadil  Advil Cope Indocin Robaxisal  Alka-Seltzer Effervescent Pain Reliever and Antacid Coricidin or Coricidin-D  Indomethacin Rufen  Alka-Seltzer plus Cold Medicine Cosprin Ketoprofen S-A-C Tablets  Anacin Analgesic Tablets or Capsules Coumadin Korlgesic Salflex  Anacin Extra Strength Analgesic tablets or capsules CP-2 Tablets Lanoril Salicylate  Anaprox Cuprimine Capsules Levenox Salocol  Anexsia-D Dalteparin Magan Salsalate  Anodynos Darvon compound Magnesium Salicylate Sine-off  Ansaid Dasin Capsules Magsal Sodium Salicylate  Anturane Depen Capsules Marnal Soma  APF Arthritis pain formula Dewitt's Pills Measurin Stanback  Argesic Dia-Gesic Meclofenamic Sulfinpyrazone  Arthritis Bayer Timed Release Aspirin Diclofenac Meclomen Sulindac  Arthritis pain formula Anacin Dicumarol Medipren Supac  Analgesic (Safety coated) Arthralgen Diffunasal Mefanamic Suprofen  Arthritis Strength Bufferin Dihydrocodeine Mepro Compound Suprol  Arthropan liquid Dopirydamole Methcarbomol with Aspirin Synalgos  ASA tablets/Enseals Disalcid Micrainin Tagament  Ascriptin Doan's Midol Talwin  Ascriptin A/D Dolene Mobidin Tanderil  Ascriptin Extra Strength Dolobid Moblgesic Ticlid  Ascriptin with Codeine Doloprin or Doloprin with Codeine Momentum Tolectin  Asperbuf Duoprin Mono-gesic Trendar  Aspergum Duradyne Motrin or Motrin IB Triminicin  Aspirin plain, buffered or enteric  coated Durasal Myochrisine Trigesic  Aspirin Suppositories Easprin Nalfon Trillsate  Aspirin with Codeine Ecotrin Regular or Extra Strength Naprosyn Uracel  Atromid-S Efficin Naproxen Ursinus  Auranofin Capsules Elmiron Neocylate Vanquish  Axotal Emagrin Norgesic Verin  Azathioprine Empirin or Empirin with Codeine Normiflo Vitamin E  Azolid Emprazil Nuprin Voltaren  Bayer Aspirin plain, buffered or children's or timed BC Tablets or powders Encaprin Orgaran Warfarin Sodium  Buff-a-Comp Enoxaparin Orudis Zorpin  Buff-a-Comp with Codeine Equegesic Os-Cal-Gesic   Buffaprin Excedrin plain, buffered or Extra Strength Oxalid   Bufferin Arthritis Strength Feldene Oxphenbutazone   Bufferin plain or Extra Strength Feldene Capsules Oxycodone with Aspirin   Bufferin with Codeine Fenoprofen Fenoprofen Pabalate or Pabalate-SF   Buffets II Flogesic Panagesic   Buffinol plain or Extra Strength Florinal or Florinal with Codeine Panwarfarin   Buf-Tabs Flurbiprofen Penicillamine   Butalbital Compound Four-way cold tablets Penicillin   Butazolidin Fragmin Pepto-Bismol   Carbenicillin Geminisyn Percodan   Carna Arthritis Reliever Geopen Persantine   Carprofen Gold's salt Persistin   Chloramphenicol Goody's Phenylbutazone   Chloromycetin Haltrain Piroxlcam   Clmetidine heparin Plaquenil   Cllnoril Hyco-pap Ponstel   Clofibrate Hydroxy chloroquine Propoxyphen         Before stopping any of these medications, be sure to consult the physician who ordered them.  Some, such as Coumadin (Warfarin) are ordered to prevent or treat serious conditions such as "deep thrombosis", "pumonary embolisms", and other heart problems.  The amount of time that you may need off of the medication may also vary with the medication and the reason for which you were taking it.  If you are taking any of these medications, please make sure you notify your pain physician before you undergo any procedures.  Facet Blocks Patient  Information  Description: The facets are joints in the spine between the vertebrae.  Like any joints in the body, facets can become irritated and painful.  Arthritis can also effect the facets.  By injecting steroids and local anesthetic in and around these joints, we can temporarily block the nerve supply to them.  Steroids act directly on irritated nerves and tissues to reduce selling and inflammation which often leads to decreased pain.  Facet blocks may be done anywhere along the spine from the neck to  the low back depending upon the location of your pain.   After numbing the skin with local anesthetic (like Novocaine), a small needle is passed onto the facet joints under x-ray guidance.  You may experience a sensation of pressure while this is being done.  The entire block usually lasts about 15-25 minutes.   Conditions which may be treated by facet blocks:   Low back/buttock pain  Neck/shoulder pain  Certain types of headaches  Preparation for the injection:  1. Do not eat any solid food or dairy products within 8 hours of your appointment. 2. You may drink clear liquid up to 3 hours before appointment.  Clear liquids include water, black coffee, juice or soda.  No milk or cream please. 3. You may take your regular medication, including pain medications, with a sip of water before your appointment.  Diabetics should hold regular insulin (if taken separately) and take 1/2 normal NPH dose the morning of the procedure.  Carry some sugar containing items with you to your appointment. 4. A driver must accompany you and be prepared to drive you home after your procedure. 5. Bring all your current medications with you. 6. An IV may be inserted and sedation may be given at the discretion of the physician. 7. A blood pressure cuff, EKG and other monitors will often be applied during the procedure.  Some patients may need to have extra oxygen administered for a short period. 8. You will be asked  to provide medical information, including your allergies and medications, prior to the procedure.  We must know immediately if you are taking blood thinners (like Coumadin/Warfarin) or if you are allergic to IV iodine contrast (dye).  We must know if you could possible be pregnant.  Possible side-effects:   Bleeding from needle site  Infection (rare, may require surgery)  Nerve injury (rare)  Numbness & tingling (temporary)  Difficulty urinating (rare, temporary)  Spinal headache (a headache worse with upright posture)  Light-headedness (temporary)  Pain at injection site (serveral days)  Decreased blood pressure (rare, temporary)  Weakness in arm/leg (temporary)  Pressure sensation in back/neck (temporary)   Call if you experience:   Fever/chills associated with headache or increased back/neck pain  Headache worsened by an upright position  New onset, weakness or numbness of an extremity below the injection site  Hives or difficulty breathing (go to the emergency room)  Inflammation or drainage at the injection site(s)  Severe back/neck pain greater than usual  New symptoms which are concerning to you  Please note:  Although the local anesthetic injected can often make your back or neck feel good for several hours after the injection, the pain will likely return. It takes 3-7 days for steroids to work.  You may not notice any pain relief for at least one week.  If effective, we will often do a series of 2-3 injections spaced 3-6 weeks apart to maximally decrease your pain.  After the initial series, you may be a candidate for a more permanent nerve block of the facets.  If you have any questions, please call #[phone]) [facility]

## 2018-03-04 ENCOUNTER — Encounter: Payer: Self-pay | Admitting: Nurse Practitioner

## 2018-03-04 ENCOUNTER — Ambulatory Visit: Payer: BC Managed Care – PPO | Admitting: Nurse Practitioner

## 2018-03-04 VITALS — BP 120/80 | HR 83 | Temp 98.2°F | Resp 16 | Ht 65.0 in | Wt 139.8 lb

## 2018-03-04 DIAGNOSIS — N3091 Cystitis, unspecified with hematuria: Secondary | ICD-10-CM | POA: Diagnosis not present

## 2018-03-04 DIAGNOSIS — R399 Unspecified symptoms and signs involving the genitourinary system: Secondary | ICD-10-CM | POA: Diagnosis not present

## 2018-03-04 LAB — POCT URINALYSIS DIPSTICK
Bilirubin, UA: NEGATIVE
Glucose, UA: NEGATIVE
Ketones, UA: NEGATIVE
Nitrite, UA: NEGATIVE
PH UA: 8.5 — AB (ref 5.0–8.0)
Protein, UA: POSITIVE — AB
Spec Grav, UA: 1.015 (ref 1.010–1.025)
Urobilinogen, UA: 0.2 E.U./dL

## 2018-03-04 MED ORDER — NITROFURANTOIN MONOHYD MACRO 100 MG PO CAPS: 100.0000 mg | ORAL_CAPSULE | Freq: Two times a day (BID) | ORAL | 0 refills | Status: DC

## 2018-03-04 NOTE — Patient Instructions (Addendum)
Your symptoms should begin to improve within a day of starting antibiotics. But you should finish all the antibiotic pills you get to ensure the bacteria is killed and prevent antibiotic resistance.  If you are having pain when you pee, you can also take a medicine to numb your bladder. Look for Phenazopyridine (Pyridium or AZO) over the counter at your pharmacy. This medicine eases the pain caused by urinary tract infections. It also reduces the need to urinate.  If you frequently get UTI's here are some prevention tips:  - Avoiding spermicides  - Drinking more fluid - This can help prevent bladder infections. ?Urinating right after sex - Some doctors think this helps, because it helps flush out germs that might get into the bladder during sex. There is no proof it works, but it also cannot hurt. ?Vaginal estrogen - If you are a woman who has already been through menopause, your doctor might suggest this. Vaginal estrogen comes in a cream or a flexible ring that you put into your vagina. It can help prevent bladder infections. ?Antibiotics - If you get a lot of bladder infections, and the above methods have not helped, your doctor might give you antibiotics to help prevent infection. But taking antibiotics has downsides, so doctors usually suggest trying other things first   The studies suggesting that cranberry products prevent bladder infections are not very good. Other studies suggest that cranberry products do not prevent bladder infections. But if you want to try cranberry products for this purpose, there is probably not much harm in doing so.  - If you continue to have increased UTI's we can consider a post-coital antibiotic therapy.

## 2018-03-04 NOTE — Progress Notes (Signed)
Name: Kaitlyn Harvey   MRN: [id number]    DOB: 14-Feb-1975   Date:03/04/2018       Progress Note  Subjective  Chief Complaint  Chief Complaint  Patient presents with  . UTI symptoms    pressure when urinating for 3days    HPI  Patient endorses pressure to urinate and urinary frequency for the past 3 days. States has mild dysuria initially. Denies nausea, vomiting, fevers, chills, flank pain. States she feels she has had more UTIs in the past year- only comes here for them, last UTI was in 2018. Denies dyspareunia and vaginal dryness. Does note increase sex since she got married last year.   Patient Active Problem List   Diagnosis Date Noted  . Lumbar facet arthropathy 02/25/2018  . Lumbar spondylosis 02/25/2018  . Musculoskeletal pain 02/25/2018  . Chronic pain syndrome 02/25/2018  . Compression fracture of T9 vertebra ([place]) 12/24/2017  . Hx of tobacco use, presenting hazards to health 12/24/2017  . Murmur 05/09/2016  . Palpitations 05/09/2016  . Antibiotic-induced yeast infection 01/31/2016  . Lumbar degenerative disc disease 08/09/2015  . Facet syndrome, lumbar 08/09/2015  . 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 Well woman exam without gynecological exam 02/28/2015  . Tobacco abuse 02/28/2015  . Screening for breast cancer 02/28/2015    Past Medical History:  Diagnosis Date  . Arthritis    chronic back pain  . Thoracic compression fracture, sequela 12/24/2017   March 31, 2013    Past Surgical History:  Procedure Laterality Date  . UPPER GASTROINTESTINAL ENDOSCOPY    . UPPER GI ENDOSCOPY      Social History   Tobacco Use  . Smoking status: Former Smoker    Packs/day: 0.50    Years: 20.00    Pack years: 10.00    Types: Cigarettes    Start date: 12/20/1994    Last attempt to quit: 06/09/2015    Years since quitting: 2.7  . Smokeless tobacco: Never Used  Substance Use Topics  . Alcohol use: Yes    Alcohol/week: 2.0 standard drinks    Types: 2 Glasses of wine  per week     Current Outpatient Medications:  .  acetaminophen (TYLENOL) 500 MG tablet, Take 500 mg by mouth every 6 (six) hours as needed., Disp: , Rfl:  .  Biotin 10 MG CAPS, Take 1 capsule by mouth daily., Disp: , Rfl:  .  calcium-vitamin D (OSCAL WITH D) 500-200 MG-UNIT tablet, Take 1 tablet by mouth., Disp: , Rfl:  .  diclofenac (VOLTAREN) 75 MG EC tablet, Take 1 tablet (75 mg total) by mouth 2 (two) times daily., Disp: 60 tablet, Rfl: 3 .  ibuprofen (ADVIL,MOTRIN) 600 MG tablet, Take 1 tablet (600 mg total) by mouth every 6 (six) hours as needed. Take with food; stop if abd pain or blood in stool, Disp: 40 tablet, Rfl: 0 .  Multiple Vitamin (MULTIVITAMIN) tablet, Take 1 tablet by mouth daily., Disp: , Rfl:  .  nitrofurantoin, macrocrystal-monohydrate, (MACROBID) 100 MG capsule, Take 1 capsule (100 mg total) by mouth 2 (two) times daily., Disp: 10 capsule, Rfl: 0  No Known Allergies  ROS    No other specific complaints in a complete review of systems (except as listed in HPI above).  Objective  Vitals:   03/04/18 1542  BP: 120/80  Pulse: 83  Resp: 16  Temp: 98.2 F (36.8 C)  TempSrc: Oral  SpO2: 99%  Weight: 139 lb 12.8 oz (63.4 kg)  Height: 5' 5"  (1.651  m)     Body mass index is 23.26 kg/m.  Nursing Note and Vital Signs reviewed.  Physical Exam Constitutional:      Appearance: Normal appearance. She is well-developed.  HENT:     Head: Normocephalic and atraumatic.     Right Ear: Hearing normal.     Left Ear: Hearing normal.  Eyes:     Conjunctiva/sclera: Conjunctivae normal.  Cardiovascular:     Rate and Rhythm: Normal rate.     Heart sounds: Normal heart sounds.  Pulmonary:     Effort: Pulmonary effort is normal.  Abdominal:     Tenderness: There is no right CVA tenderness or left CVA tenderness.  Musculoskeletal: Normal range of motion.  Neurological:     Mental Status: She is alert and oriented to person, place, and time.  Psychiatric:         Speech: Speech normal.        Behavior: Behavior normal. Behavior is cooperative.        Thought Content: Thought content normal.        Judgment: Judgment normal.       Results for orders placed or performed in visit on 03/04/18 (from the past 48 hour(s))  POCT Urinalysis Dipstick     Status: Abnormal   Collection Time: 03/04/18  4:00 PM  Result Value Ref Range   Color, UA dark brown    Clarity, UA cloudy    Glucose, UA Negative Negative   Bilirubin, UA Negative    Ketones, UA negative    Spec Grav, UA 1.015 1.010 - 1.025   Blood, UA small    pH, UA 8.5 (A) 5.0 - 8.0   Protein, UA Positive (A) Negative   Urobilinogen, UA 0.2 0.2 or 1.0 E.U./dL   Nitrite, UA Negative    Leukocytes, UA Large (3+) (A) Negative   Appearance dark brown    Odor      Assessment & Plan  1. UTI symptoms Discussed post-coital urination, hydration, avoid douching, wipe front to back.  - POCT Urinalysis Dipstick - nitrofurantoin, macrocrystal-monohydrate, (MACROBID) 100 MG capsule; Take 1 capsule (100 mg total) by mouth 2 (two) times daily.  Dispense: 10 capsule; Refill: 0  2. Cystitis with hematuria - nitrofurantoin, macrocrystal-monohydrate, (MACROBID) 100 MG capsule; Take 1 capsule (100 mg total) by mouth 2 (two) times daily.  Dispense: 10 capsule; Refill: 0 - Urine Culture  Follow up for blood in urine at pelvic exam next month.

## 2018-03-06 LAB — URINE CULTURE
MICRO NUMBER:: 271218
SPECIMEN QUALITY: ADEQUATE

## 2018-03-12 ENCOUNTER — Encounter: Payer: Self-pay | Admitting: Student in an Organized Health Care Education/Training Program

## 2018-03-12 ENCOUNTER — Ambulatory Visit
Admission: RE | Admit: 2018-03-12 | Discharge: 2018-03-12 | Disposition: A | Discharge status: Home / Self Care | Payer: BC Managed Care – PPO | Source: Ambulatory Visit | Attending: Student in an Organized Health Care Education/Training Program | Admitting: Student in an Organized Health Care Education/Training Program

## 2018-03-12 ENCOUNTER — Other Ambulatory Visit: Payer: Self-pay

## 2018-03-12 ENCOUNTER — Ambulatory Visit (HOSPITAL_BASED_OUTPATIENT_CLINIC_OR_DEPARTMENT_OTHER): Payer: BC Managed Care – PPO | Admitting: Student in an Organized Health Care Education/Training Program

## 2018-03-12 VITALS — BP 122/78 | HR 76 | Temp 97.5°F | Resp 20 | Ht 65.0 in | Wt 139.0 lb

## 2018-03-12 DIAGNOSIS — M47816 Spondylosis without myelopathy or radiculopathy, lumbar region: Secondary | ICD-10-CM

## 2018-03-12 DIAGNOSIS — M545 Low back pain: Secondary | ICD-10-CM | POA: Diagnosis present

## 2018-03-12 DIAGNOSIS — G8929 Other chronic pain: Secondary | ICD-10-CM | POA: Diagnosis present

## 2018-03-12 MED ORDER — DEXAMETHASONE SODIUM PHOSPHATE 10 MG/ML IJ SOLN
10.0000 mg | Freq: Once | INTRAMUSCULAR | Status: AC
  Administered 2018-03-12: 10 mg

## 2018-03-12 MED ORDER — DEXAMETHASONE SODIUM PHOSPHATE 10 MG/ML IJ SOLN
INTRAMUSCULAR | Status: AC
  Filled 2018-03-12: qty 2

## 2018-03-12 MED ORDER — LIDOCAINE HCL 2 % IJ SOLN
INTRAMUSCULAR | Status: AC
  Filled 2018-03-12: qty 20

## 2018-03-12 MED ORDER — FENTANYL CITRATE (PF) 100 MCG/2ML IJ SOLN
INTRAMUSCULAR | Status: AC
  Filled 2018-03-12: qty 2

## 2018-03-12 MED ORDER — LIDOCAINE HCL 2 % IJ SOLN
20.0000 mL | Freq: Once | INTRAMUSCULAR | Status: AC
  Administered 2018-03-12: 400 mg

## 2018-03-12 MED ORDER — ROPIVACAINE HCL 2 MG/ML IJ SOLN
18.0000 mL | Freq: Once | INTRAMUSCULAR | Status: AC
  Administered 2018-03-12: 10 mL via PERINEURAL

## 2018-03-12 MED ORDER — ROPIVACAINE HCL 2 MG/ML IJ SOLN
INTRAMUSCULAR | Status: AC
  Filled 2018-03-12: qty 10

## 2018-03-12 MED ORDER — FENTANYL CITRATE (PF) 100 MCG/2ML IJ SOLN
25.0000 ug | INTRAMUSCULAR | Status: DC | PRN
  Administered 2018-03-12: 50 ug via INTRAVENOUS

## 2018-03-12 NOTE — Progress Notes (Signed)
Patient's Name: Kaitlyn Harvey  MRN: [id number]  Referring Provider: Arnetha Courser, MD  DOB: April 25, 1975  PCP: Arnetha Courser, MD  DOS: 03/12/2018  Note by: Gillis Santa, MD  Service setting: Ambulatory outpatient  Specialty: Interventional Pain Management  Patient type: Established  Location: [facility] (AMB) Pain Management Facility  Visit type: Interventional Procedure   Primary Reason for Visit: Interventional Pain Management Treatment. CC: Back Pain (lower)  Procedure:          Anesthesia, Analgesia, Anxiolysis:  Type: Lumbar Facet, Medial Branch Block(s)          Primary Purpose: Diagnostic Region: Posterolateral Lumbosacral Spine Level:  L3, L4, L5,  Medial Branch Level(s). Injecting these levels blocks the L3-4, L4-5, lumbar facet joints. Laterality: Bilateral  Type: Moderate (Conscious) Sedation combined with Local Anesthesia Indication(s): Analgesia and Anxiety Route: Intravenous (IV) IV Access: Secured Sedation: Meaningful verbal contact was maintained at all times during the procedure  Local Anesthetic: Lidocaine 1-2%  Position: Prone   Indications: 1. Lumbar facet arthropathy   2. Lumbar spondylosis   3. Chronic bilateral low back pain without sciatica    Pain Score: Pre-procedure: 4 /10 Post-procedure: 0-No pain/10  Pre-op Assessment:  [name] is a 43 y.o. (year old), female patient, seen today for interventional treatment. She  has a past surgical history that includes Upper gi endoscopy and Upper gastrointestinal endoscopy. Ms. Capwell has a current medication list which includes the following prescription(s): acetaminophen, biotin, calcium-vitamin d, diclofenac, ibuprofen, multivitamin, and nitrofurantoin (macrocrystal-monohydrate), and the following Facility-Administered Medications: fentanyl. Her primarily concern today is the Back Pain (lower)  Initial Vital Signs:  Pulse/HCG Rate: 62ECG Heart Rate: 78 Temp: 97.9 F (36.6 C) Resp: 16 BP: 115/88 SpO2: 100 %  BMI:  Estimated body mass index is 23.13 kg/m as calculated from the following:   Height as of this encounter: 5' 5"  (1.651 m).   Weight as of this encounter: 139 lb (63 kg).  Risk Assessment: Allergies: Reviewed. She has No Known Allergies.  Allergy Precautions: None required Coagulopathies: Reviewed. None identified.  Blood-thinner therapy: None at this time Active Infection(s): Reviewed. None identified. [name] is afebrile  Site Confirmation: [name] was asked to confirm the procedure and laterality before marking the site Procedure checklist: Completed Consent: Before the procedure and under the influence of no sedative(s), amnesic(s), or anxiolytics, the patient was informed of the treatment options, risks and possible complications. To fulfill our ethical and legal obligations, as recommended by the American Medical Association's Code of Ethics, I have informed the patient of my clinical impression; the nature and purpose of the treatment or procedure; the risks, benefits, and possible complications of the intervention; the alternatives, including doing nothing; the risk(s) and benefit(s) of the alternative treatment(s) or procedure(s); and the risk(s) and benefit(s) of doing nothing. The patient was provided information about the general risks and possible complications associated with the procedure. These may include, but are not limited to: failure to achieve desired goals, infection, bleeding, organ or nerve damage, allergic reactions, paralysis, and death. In addition, the patient was informed of those risks and complications associated to Spine-related procedures, such as failure to decrease pain; infection (i.e.: Meningitis, epidural or intraspinal abscess); bleeding (i.e.: epidural hematoma, subarachnoid hemorrhage, or any other type of intraspinal or peri-dural bleeding); organ or nerve damage (i.e.: Any type of peripheral nerve, nerve root, or spinal cord injury) with subsequent damage  to sensory, motor, and/or autonomic systems, resulting in permanent pain, numbness, and/or weakness of one  or several areas of the body; allergic reactions; (i.e.: anaphylactic reaction); and/or death. Furthermore, the patient was informed of those risks and complications associated with the medications. These include, but are not limited to: allergic reactions (i.e.: anaphylactic or anaphylactoid reaction(s)); adrenal axis suppression; blood sugar elevation that in diabetics may result in ketoacidosis or comma; water retention that in patients with history of congestive heart failure may result in shortness of breath, pulmonary edema, and decompensation with resultant heart failure; weight gain; swelling or edema; medication-induced neural toxicity; particulate matter embolism and blood vessel occlusion with resultant organ, and/or nervous system infarction; and/or aseptic necrosis of one or more joints. Finally, the patient was informed that Medicine is not an exact science; therefore, there is also the possibility of unforeseen or unpredictable risks and/or possible complications that may result in a catastrophic outcome. The patient indicated having understood very clearly. We have given the patient no guarantees and we have made no promises. Enough time was given to the patient to ask questions, all of which were answered to the patient's satisfaction. [name] has indicated that she wanted to continue with the procedure. Attestation: I, the ordering provider, attest that I have discussed with the patient the benefits, risks, side-effects, alternatives, likelihood of achieving goals, and potential problems during recovery for the procedure that I have provided informed consent. Date   Time: 03/12/2018  9:50 AM  Pre-Procedure Preparation:  Monitoring: As per clinic protocol. Respiration, ETCO2, SpO2, BP, heart rate and rhythm monitor placed and checked for adequate function Safety Precautions: Patient was  assessed for positional comfort and pressure points before starting the procedure. Time-out: I initiated and conducted the "Time-out" before starting the procedure, as per protocol. The patient was asked to participate by confirming the accuracy of the "Time Out" information. Verification of the correct person, site, and procedure were performed and confirmed by me, the nursing staff, and the patient. "Time-out" conducted as per Joint Commission's Universal Protocol (UP.01.01.01). Time: 1033  Description of Procedure:          Laterality: Bilateral. The procedure was performed in identical fashion on both sides. Levels:  L3, L4, L5,  Medial Branch Level(s) Area Prepped: Posterior Lumbosacral Region Prepping solution: ChloraPrep (2% chlorhexidine gluconate and 70% isopropyl alcohol) Safety Precautions: Aspiration looking for blood return was conducted prior to all injections. At no point did we inject any substances, as a needle was being advanced. Before injecting, the patient was told to immediately notify me if she was experiencing any new onset of "ringing in the ears, or metallic taste in the mouth". No attempts were made at seeking any paresthesias. Safe injection practices and needle disposal techniques used. Medications properly checked for expiration dates. SDV (single dose vial) medications used. After the completion of the procedure, all disposable equipment used was discarded in the proper designated medical waste containers. Local Anesthesia: Protocol guidelines were followed. The patient was positioned over the fluoroscopy table. The area was prepped in the usual manner. The time-out was completed. The target area was identified using fluoroscopy. A 12-in long, straight, sterile hemostat was used with fluoroscopic guidance to locate the targets for each level blocked. Once located, the skin was marked with an approved surgical skin marker. Once all sites were marked, the skin (epidermis,  dermis, and hypodermis), as well as deeper tissues (fat, connective tissue and muscle) were infiltrated with a small amount of a short-acting local anesthetic, loaded on a 10cc syringe with a 25G, 1.5-in  Needle. An appropriate  amount of time was allowed for local anesthetics to take effect before proceeding to the next step. Local Anesthetic: Lidocaine 2.0% The unused portion of the local anesthetic was discarded in the proper designated containers. Technical explanation of process:   L3 Medial Branch Nerve Block (MBB): The target area for the L3 medial branch is at the junction of the postero-lateral aspect of the superior articular process and the superior, posterior, and medial edge of the transverse process of L4. Under fluoroscopic guidance, a Quincke needle was inserted until contact was made with os over the superior postero-lateral aspect of the pedicular shadow (target area). After negative aspiration for blood,37m of the nerve block solution was injected without difficulty or complication. The needle was removed intact. L4 Medial Branch Nerve Block (MBB): The target area for the L4 medial branch is at the junction of the postero-lateral aspect of the superior articular process and the superior, posterior, and medial edge of the transverse process of L5. Under fluoroscopic guidance, a Quincke needle was inserted until contact was made with os over the superior postero-lateral aspect of the pedicular shadow (target area). After negative aspiration for blood, 165mof the nerve block solution was injected without difficulty or complication. The needle was removed intact. L5 Medial Branch Nerve Block (MBB): The target area for the L5 medial branch is at the junction of the postero-lateral aspect of the superior articular process and the superior, posterior, and medial edge of the sacral ala. Under fluoroscopic guidance, a Quincke needle was inserted until contact was made with os over the superior  postero-lateral aspect of the pedicular shadow (target area). After negative aspiration for blood, 70m29mf the nerve block solution was injected without difficulty or complication. The needle was removed intact.   Nerve block solution: 6 cc solution made of 4 cc of 0.2% ropivacaine, 2 cc of Decadron 10 mg/cc, total steroid dose 20 mg Decadron   Procedural Needles: 22-gauge, 3.5-inch, Quincke needles used for all levels.  Once the entire procedure was completed, the treated area was cleaned, making sure to leave some of the prepping solution back to take advantage of its long term bactericidal properties.   Illustration of the posterior view of the lumbar spine and the posterior neural structures. Laminae of L2 through S1 are labeled. DPRL5, dorsal primary ramus of L5; DPRS1, dorsal primary ramus of S1; DPR3, dorsal primary ramus of L3; FJ, facet (zygapophyseal) joint L3-L4; I, inferior articular process of L4; LB1, lateral branch of dorsal primary ramus of L1; IAB, inferior articular branches from L3 medial branch (supplies L4-L5 facet joint); IBP, intermediate branch plexus; MB3, medial branch of dorsal primary ramus of L3; NR3, third lumbar nerve root; S, superior articular process of L5; SAB, superior articular branches from L4 (supplies L4-5 facet joint also); TP3, transverse process of L3.  Vitals:   03/12/18 1055 03/12/18 1103 03/12/18 1111 03/12/18 1122  BP: (!) 136/92 (!) 136/101 (!) 127/94 122/78  Pulse: 76     Resp: 12 18 13 20   Temp:  97.6 F (36.4 C)  (!) 97.5 F (36.4 C)  TempSrc:      SpO2: 100% 100% 100% 100%  Weight:      Height:         Start Time: 1033 hrs. End Time: 1053 hrs.  Imaging Guidance (Spinal):          Type of Imaging Technique: Fluoroscopy Guidance (Spinal) Indication(s): Assistance in needle guidance and placement for procedures requiring needle placement in or near  specific anatomical locations not easily accessible without such assistance. Exposure Time:  Please see nurses notes. Contrast: None used. Fluoroscopic Guidance: I was personally present during the use of fluoroscopy. "Tunnel Vision Technique" used to obtain the best possible view of the target area. Parallax error corrected before commencing the procedure. "Direction-depth-direction" technique used to introduce the needle under continuous pulsed fluoroscopy. Once target was reached, antero-posterior, oblique, and lateral fluoroscopic projection used confirm needle placement in all planes. Images permanently stored in EMR. Interpretation: No contrast injected. I personally interpreted the imaging intraoperatively. Adequate needle placement confirmed in multiple planes. Permanent images saved into the patient's record.  Antibiotic Prophylaxis:   Anti-infectives (From admission, onward)   None     Indication(s): None identified  Post-operative Assessment:  Post-procedure Vital Signs:  Pulse/HCG Rate: 7679 Temp: (!) 97.5 F (36.4 C) Resp: 20 BP: 122/78 SpO2: 100 %  EBL: None  Complications: No immediate post-treatment complications observed by team, or reported by patient.  Note: The patient tolerated the entire procedure well. A repeat set of vitals were taken after the procedure and the patient was kept under observation following institutional policy, for this type of procedure. Post-procedural neurological assessment was performed, showing return to baseline, prior to discharge. The patient was provided with post-procedure discharge instructions, including a section on how to identify potential problems. Should any problems arise concerning this procedure, the patient was given instructions to immediately contact us, at any time, without hesitation. In any case, we plan to contact the patient by telephone for a follow-up status report regarding this interventional procedure.  Comments:  No additional relevant information.  Plan of Care  Orders:  Orders Placed This Encounter    Procedures   DG C-Arm 1-60 Min-No Report    Intraoperative interpretation by procedural physician at [facility].    Standing Status:   Standing    Number of Occurrences:   1    Order Specific Question:   Reason for exam:    Answer:   Assistance in needle guidance and placement for procedures requiring needle placement in or near specific anatomical locations not easily accessible without such assistance.   Medications ordered for procedure: Meds ordered this encounter  Medications   lidocaine (XYLOCAINE) 2 % (with pres) injection 400 mg   fentaNYL (SUBLIMAZE) injection 25-50 mcg    Make sure Narcan is available in the pyxis when using this medication. In the event of respiratory depression (RR< 8/min): Titrate NARCAN (naloxone) in increments of 0.1 to 0.2 mg IV at 2-3 minute intervals, until desired degree of reversal.   ropivacaine (PF) 2 mg/mL (0.2%) (NAROPIN) injection 18 mL   dexamethasone (DECADRON) injection 10 mg   dexamethasone (DECADRON) injection 10 mg   Medications administered: We administered lidocaine, fentaNYL, ropivacaine (PF) 2 mg/mL (0.2%), dexamethasone, and dexamethasone.  See the medical record for exact dosing, route, and time of administration.  Disposition: Discharge home  Discharge Date & Time: 03/12/2018; 1123 hrs.   Follow-up plan:   Return in about 4 weeks (around 04/09/2018) for Post Procedure Evaluation.     Future Appointments  Date Time Provider [place]  04/15/2018 12:00 PM Gillis Santa, MD [facility] None  04/29/2018  8:00 AM Lada, Satira Anis, MD [facility]   Primary Care Physician: Arnetha Courser, MD Location: [facility] Note by: Gillis Santa, MD Date: 03/12/2018; Time: 2:57 PM  Disclaimer:  Medicine is not an exact science. The only guarantee in medicine is that nothing is  guaranteed. It is important to note that the decision to proceed with this intervention was based on the  information collected from the patient. The Data and conclusions were drawn from the patient's questionnaire, the interview, and the physical examination. Because the information was provided in large part by the patient, it cannot be guaranteed that it has not been purposely or unconsciously manipulated. Every effort has been made to obtain as much relevant data as possible for this evaluation. It is important to note that the conclusions that lead to this procedure are derived in large part from the available data. Always take into account that the treatment will also be dependent on availability of resources and existing treatment guidelines, considered by other Pain Management Practitioners as being common knowledge and practice, at the time of the intervention. For Medico-Legal purposes, it is also important to point out that variation in procedural techniques and pharmacological choices are the acceptable norm. The indications, contraindications, technique, and results of the above procedure should only be interpreted and judged by a Board-Certified Interventional Pain Specialist with extensive familiarity and expertise in the same exact procedure and technique.

## 2018-03-12 NOTE — Progress Notes (Signed)
Safety precautions to be maintained throughout the outpatient stay will include: orient to surroundings, keep bed in low position, maintain call bell within reach at all times, provide assistance with transfer out of bed and ambulation.  

## 2018-03-12 NOTE — Patient Instructions (Signed)

## 2018-03-13 ENCOUNTER — Telehealth: Payer: Self-pay

## 2018-03-13 NOTE — Telephone Encounter (Signed)
Post procedure phone call.  LM 

## 2018-04-15 ENCOUNTER — Ambulatory Visit: Payer: BC Managed Care – PPO | Admitting: Student in an Organized Health Care Education/Training Program

## 2018-04-24 ENCOUNTER — Ambulatory Visit
Payer: BC Managed Care – PPO | Attending: Student in an Organized Health Care Education/Training Program | Admitting: Student in an Organized Health Care Education/Training Program

## 2018-04-24 ENCOUNTER — Other Ambulatory Visit: Payer: Self-pay

## 2018-04-24 DIAGNOSIS — M545 Low back pain, unspecified: Secondary | ICD-10-CM

## 2018-04-24 DIAGNOSIS — M5136 Other intervertebral disc degeneration, lumbar region: Secondary | ICD-10-CM | POA: Diagnosis not present

## 2018-04-24 DIAGNOSIS — G8929 Other chronic pain: Secondary | ICD-10-CM | POA: Diagnosis not present

## 2018-04-24 DIAGNOSIS — M47816 Spondylosis without myelopathy or radiculopathy, lumbar region: Secondary | ICD-10-CM | POA: Diagnosis not present

## 2018-04-24 NOTE — Progress Notes (Signed)
Pain Management Virtual Encounter Note - Virtual Visit via [place] (real-time audio visits between healthcare provider and patient).  Patient's Phone No. & Preferred Pharmacy:  [phone] (home); [phone] (mobile); (Preferred) [phone] No e-mail address on record  [id number] [id number]: [phone]: [phone]  Pre-screening note:  Our staff contacted Ms. Matlock and offered her an "in person", "face-to-face" appointment versus a telephone encounter. She indicated preferring the telephone encounter, at this time.  Reason for Virtual Visit: COVID-19*  Social distancing based on CDC and AMA recommendations.   I contacted AImanni Burdineon 04/24/2018 at 1:02 PM via video conference and clearly identified myself as BGillis Santa MD. I verified that I was speaking with the correct person using two identifiers (Name and date of birth: [id number].  Advanced Informed Consent I sought verbal advanced consent from AAngeline Slimfor virtual visit interactions. I informed Ms. Molner of possible security and privacy concerns, risks, and limitations associated with providing "not-in-person" medical evaluation and management services. I also informed Ms. Whan of the availability of "in-person" appointments. Finally, I informed her that there would be a charge for the virtual visit and that she could be  personally, fully or partially, financially responsible for it. Ms. PManninenexpressed understanding and agreed to proceed.   Historic Elements   Ms. ALaina Guerrieriis a 43y.o. year old, female patient evaluated today after her last encounter by our practice on 03/13/2018. Ms. PHolshouser has a past medical history of Arthritis and Thoracic compression fracture, sequela (12/24/2017). She also  has a past surgical history that includes Upper gi endoscopy and Upper gastrointestinal endoscopy. Ms. PTopetehas a current  medication list which includes the following prescription(s): acetaminophen, biotin, calcium-vitamin d, diclofenac, ibuprofen, multivitamin, and nitrofurantoin (macrocrystal-monohydrate). She  reports that she quit smoking about 2 years ago. Her smoking use included cigarettes. She started smoking about 23 years ago. She has a 10.00 pack-year smoking history. She has never used smokeless tobacco. She reports current alcohol use of about 2.0 standard drinks of alcohol per week. She reports that she does not use drugs. Ms. PSchuchardhas No Known Allergies.   HPI  I last saw her on 03/12/2018. She is being evaluated for a post-procedure assessment.  Post-Procedure Evaluation  Procedure: Bilateral L3, L4, L5 facet medial branch nerve block #1 Pre-procedure pain level:  4/10 Post-procedure: 0/10          Sedation: Please see nurses note.  Effectiveness during initial hour after procedure(Ultra-Short Term Relief):100%  Local anesthetic used: Long-acting (4-6 hours) Effectiveness: Defined as any analgesic benefit obtained secondary to the administration of local anesthetics. This carries significant diagnostic value as to the etiological location, or anatomical origin, of the pain. Duration of benefit is expected to coincide with the duration of the local anesthetic used.  Effectiveness during initial 4-6 hours after procedure(Short-Term Relief):100 %  Long-term benefit: Defined as any relief past the pharmacologic duration of the local anesthetics.  Effectiveness past the initial 6 hours after procedure(Long-Term Relief):100 %  Current benefits: Defined as benefit that persist at this time.   Analgesia:  >50% relief Function: Ms. PHouglandreports improvement in function ROM: Ms. PPaonereports improvement in ROM   Review of recent tests  DG C-Arm 1-60 Min-No Report Fluoroscopy was utilized by the requesting physician.  No radiographic  interpretation.    Office Visit on 03/04/2018  Component Date Value  Ref Range Status  .  Color, UA 03/04/2018 dark brown   Final  . Clarity, UA 03/04/2018 cloudy   Final  . Glucose, UA 03/04/2018 Negative  Negative Final  . Bilirubin, UA 03/04/2018 Negative   Final  . Ketones, UA 03/04/2018 negative   Final  . Spec Grav, UA 03/04/2018 1.015  1.010 - 1.025 Final  . Blood, UA 03/04/2018 small   Final  . pH, UA 03/04/2018 8.5* 5.0 - 8.0 Final  . Protein, UA 03/04/2018 Positive* Negative Final  . Urobilinogen, UA 03/04/2018 0.2  0.2 or 1.0 E.U./dL Final  . Nitrite, UA 03/04/2018 Negative   Final  . Leukocytes, UA 03/04/2018 Large (3+)* Negative Final  . Appearance 03/04/2018 dark brown   Final  . MICRO NUMBER: 03/04/2018 [id number]   Final  . SPECIMEN QUALITY: 03/04/2018 Adequate   Final  . Sample Source 03/04/2018 URINE   Final  . STATUS: 03/04/2018 FINAL   Final  . ISOLATE 1: 03/04/2018 Escherichia coli*  Final   Greater than 100,000 CFU/mL of Escherichia coli   Assessment  The primary encounter diagnosis was Lumbar facet arthropathy. Diagnoses of Lumbar spondylosis, Lumbar degenerative disc disease (modeate L4/L5), and Chronic bilateral low back pain without sciatica were also pertinent to this visit.  Plan of Care  I am having [name] maintain her multivitamin, Biotin, acetaminophen, ibuprofen, calcium-vitamin D, diclofenac, and nitrofurantoin (macrocrystal-monohydrate).   Postprocedural evaluation.  Patient is obtaining benefit after diagnostic lumbar facet medial branch nerve blocks.  She notes a reduction in her overall low back pain levels.  She states that she is able to do more and has improved range of motion and functional status.  This is also in part due to the fact that the patient is currently not working.  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  Since she is not doing this any further, she states  that her back pain is significantly better.  We will place PRN order for repeat lumbar facet medial branch nerve blocks as below if and when axial low back pain return should the patient want to lumbar facet medial branch nerve blocks for flareup of lumbar facet pain.  Orders:  Orders Placed This Encounter  Procedures  . LUMBAR FACET(MEDIAL BRANCH NERVE BLOCK) MBNB    Standing Status:   Standing    Number of Occurrences:   5    Standing Expiration Date:   04/24/2019    Scheduling Instructions:     Purpose: Therapeutic     Indication: Axial low back pain. Lumbosacral Spondylosis (M47.897).      Side: Bilateral     Level: L3-4, L4-5, Facets ( L3, L4, L5, Medial Branch Nerves)     Sedation: With Sedation.     TIMEFRAME: PRN procedure. (Ms. Simone will call when needed.)    Order Specific Question:   Where will this procedure be performed?    Answer:   [facility]   Follow-up plan:   Return if symptoms worsen or fail to improve, for Procedure.   I discussed the assessment and treatment plan with the patient. The patient was provided an opportunity to ask questions and all were answered. The patient agreed with the plan and demonstrated an understanding of the instructions.  Patient advised to call back or seek an in-person evaluation if the symptoms or condition worsens.  Total duration of non-face-to-face encounter: 15 minutes.  Note by: Gillis Santa, MD Date: 04/24/2018; Time: 1:02 PM  Disclaimer:  * Given the special circumstances of the COVID-19 pandemic, the federal government has announced that the Office for Civil Rights (OCR) will exercise its enforcement discretion and will not impose penalties on physicians using telehealth in the event of noncompliance with regulatory requirements under the [place] and Accountability Act (HIPAA) in connection with the good faith provision of telehealth during the ELMRA-15 national public health emergency. ([place])

## 2018-04-29 ENCOUNTER — Encounter: Payer: BC Managed Care – PPO | Admitting: Family Medicine

## 2018-05-14 ENCOUNTER — Telehealth: Payer: Self-pay

## 2018-05-14 ENCOUNTER — Other Ambulatory Visit: Payer: Self-pay | Admitting: Family Medicine

## 2018-05-14 DIAGNOSIS — Z1231 Encounter for screening mammogram for malignant neoplasm of breast: Secondary | ICD-10-CM

## 2018-05-14 NOTE — Telephone Encounter (Signed)
Called patient from recall list.  No answer. LMOV.  This is the second attempt per recall.

## 2018-06-03 ENCOUNTER — Other Ambulatory Visit: Payer: Self-pay

## 2018-06-03 ENCOUNTER — Ambulatory Visit
Admission: RE | Admit: 2018-06-03 | Discharge: 2018-06-03 | Disposition: A | Discharge status: Home / Self Care | Payer: BC Managed Care – PPO | Source: Ambulatory Visit | Attending: Family Medicine | Admitting: Family Medicine

## 2018-06-03 DIAGNOSIS — Z1231 Encounter for screening mammogram for malignant neoplasm of breast: Secondary | ICD-10-CM | POA: Diagnosis present

## 2018-06-23 ENCOUNTER — Ambulatory Visit: Payer: BC Managed Care – PPO | Admitting: Student in an Organized Health Care Education/Training Program

## 2018-06-25 ENCOUNTER — Ambulatory Visit: Payer: BC Managed Care – PPO | Admitting: Student in an Organized Health Care Education/Training Program

## 2018-07-24 ENCOUNTER — Encounter: Payer: BC Managed Care – PPO | Admitting: Family Medicine

## 2018-07-24 ENCOUNTER — Encounter: Payer: BC Managed Care – PPO | Admitting: Nurse Practitioner

## 2018-07-31 ENCOUNTER — Encounter: Payer: BC Managed Care – PPO | Admitting: Nurse Practitioner

## 2018-08-19 ENCOUNTER — Other Ambulatory Visit (HOSPITAL_COMMUNITY)
Admission: RE | Admit: 2018-08-19 | Discharge: 2018-08-19 | Disposition: A | Discharge status: Home / Self Care | Payer: BC Managed Care – PPO | Source: Ambulatory Visit | Attending: Nurse Practitioner | Admitting: Nurse Practitioner

## 2018-08-19 ENCOUNTER — Ambulatory Visit (INDEPENDENT_AMBULATORY_CARE_PROVIDER_SITE_OTHER): Payer: BC Managed Care – PPO | Admitting: Nurse Practitioner

## 2018-08-19 ENCOUNTER — Other Ambulatory Visit: Payer: Self-pay

## 2018-08-19 ENCOUNTER — Encounter: Payer: Self-pay | Admitting: Nurse Practitioner

## 2018-08-19 VITALS — BP 126/72 | HR 99 | Temp 97.5°F | Resp 16 | Ht 65.0 in | Wt 142.1 lb

## 2018-08-19 DIAGNOSIS — Z01419 Encounter for gynecological examination (general) (routine) without abnormal findings: Secondary | ICD-10-CM | POA: Diagnosis not present

## 2018-08-19 DIAGNOSIS — Z124 Encounter for screening for malignant neoplasm of cervix: Secondary | ICD-10-CM | POA: Diagnosis not present

## 2018-08-19 DIAGNOSIS — Z1239 Encounter for other screening for malignant neoplasm of breast: Secondary | ICD-10-CM

## 2018-08-19 NOTE — Progress Notes (Signed)
Name: Kaitlyn Harvey   MRN: [id number]    DOB: 06/05/1975   Date:08/19/2018       Progress Note  Subjective  Chief Complaint  Chief Complaint  Patient presents with  . Annual Exam    HPI  Patient presents for annual CPE.  Diet:  Eats about 2 meals a day and a snack. Coffee in the morning, snacks on fruit or breakfast bard Lunch- lean cuisine, tacos, ect.  Dinner- home cooked meal, salads and sides,  Drink: lots of ice tea and occasional juice, maybe 1-2 glasses of water a day.  Occasional red wine, beer  Exercise:  Does water aerobics sometimes and youtube exercise videos 4 times a week 45 minutes.  USPSTF grade A and B recommendations    Office Visit from 08/19/2018 in [facility]  AUDIT-C Score  0     Depression: Phq 9 is  negative Depression screen [facility] 2/9 08/19/2018 03/12/2018 03/04/2018 02/25/2018 02/11/2018  Decreased Interest 0 0 0 0 0  Down, Depressed, Hopeless 0 0 0 0 0  PHQ - 2 Score 0 0 0 0 0  Altered sleeping 0 0 0 - -  Tired, decreased energy 0 0 0 - -  Change in appetite 0 0 0 - -  Feeling bad or failure about yourself  0 0 0 - -  Trouble concentrating 0 0 0 - -  Moving slowly or fidgety/restless 0 0 0 - -  Suicidal thoughts 0 0 0 - -  PHQ-9 Score 0 0 0 - -  Difficult doing work/chores Not difficult at all Not difficult at all Not difficult at all - -   Hypertension: BP Readings from Last 3 Encounters:  08/19/18 126/72  03/12/18 122/78  03/04/18 120/80   Obesity: Wt Readings from Last 3 Encounters:  08/19/18 142 lb 1.6 oz (64.5 kg)  03/12/18 139 lb (63 kg)  03/04/18 139 lb 12.8 oz (63.4 kg)   BMI Readings from Last 3 Encounters:  08/19/18 23.65 kg/m  03/12/18 23.13 kg/m  03/04/18 23.26 kg/m    Hep C Screening: denies  STD testing and prevention (HIV/chl/gon/syphilis): denies  Intimate partner violence: denies  Sexual History/Pain during Intercourse: denies  Menstrual History/LMP/Abnormal Bleeding: denies   Advanced Care  Planning: A voluntary discussion about advance care planning including the explanation and discussion of advance directives.  Discussed health care proxy and Living will, and the patient was able to identify a health care proxy as husband, Keilin Gamboa  Patient does not have a living will at present time. If patient does have living will, I have requested they bring this to the clinic to be scanned in to their chart.  Breast cancer: last mammogram 06/2018 clear  Cervical cancer screening: discussed   Osteoporosis Screening: discussed vitamin D and calcium supplementation.   Lipids:  Lab Results  Component Value Date   CHOL 178 04/09/2017   CHOL 201 (H) 03/05/2016   CHOL 169 03/08/2015   Lab Results  Component Value Date   HDL 105 04/09/2017   HDL 123 03/05/2016   HDL 112 03/08/2015   Lab Results  Component Value Date   LDLCALC 54 04/09/2017   LDLCALC 70 03/05/2016   LDLCALC 42 03/08/2015   Lab Results  Component Value Date   TRIG 105 04/09/2017   TRIG 41 03/05/2016   TRIG 77 03/08/2015   Lab Results  Component Value Date   CHOLHDL 1.7 04/09/2017   CHOLHDL 1.6 03/05/2016   CHOLHDL 1.5 03/08/2015  No results found for: LDLDIRECT  Glucose:  Glucose  Date Value Ref Range Status  03/08/2015 80 65 - 99 mg/dL Final   Glucose, Bld  Date Value Ref Range Status  04/09/2017 83 65 - 139 mg/dL Final    Comment:    .        Non-fasting reference interval .   03/05/2016 83 65 - 99 mg/dL Final    Skin cancer: discussed, does wear sunscreen  Colorectal cancer: denies history, changes in stools Lung cancer:  Low Dose CT Chest recommended if Age 53-80 years, 30 pack-year currently smoking OR have quit w/in 15years. Patient does qualify.     Patient Active Problem List   Diagnosis Date Noted  . Lumbar facet arthropathy 02/25/2018  . Lumbar spondylosis 02/25/2018  . Musculoskeletal pain 02/25/2018  . Chronic pain syndrome 02/25/2018  . Compression fracture of T9 vertebra  ([place]) 12/24/2017  . Hx of tobacco use, presenting hazards to health 12/24/2017  . Murmur 05/09/2016  . Palpitations 05/09/2016  . Antibiotic-induced yeast infection 01/31/2016  . Lumbar degenerative disc disease 08/09/2015  . Facet syndrome, lumbar 08/09/2015  . 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 Well woman exam without gynecological exam 02/28/2015  . Tobacco abuse 02/28/2015  . Screening for breast cancer 02/28/2015    Past Surgical History:  Procedure Laterality Date  . UPPER GASTROINTESTINAL ENDOSCOPY  2008  . UPPER GI ENDOSCOPY      Family History  Problem Relation Age of Onset  . Asthma Mother   . Hypertension Mother   . Thyroid disease Mother   . Other Mother        dilatation of ascending aorta  . Heart attack Father 39  . Hypertension Brother   . Bell's palsy Brother   . Heart murmur Brother   . Breast cancer Neg Hx     Social History   Socioeconomic History  . Marital status: Married    Spouse name: Joneen Boers  . Number of children: 1  . Years of education: Not on file  . Highest education level: Not on file  Occupational History  . Occupation: Pharmacist, hospital  Social Needs  . Financial resource strain: Not hard at all  . Food insecurity    Worry: Never true    Inability: Never true  . Transportation needs    Medical: No    Non-medical: No  Tobacco Use  . Smoking status: Former Smoker    Packs/day: 0.50    Years: 20.00    Pack years: 10.00    Types: Cigarettes    Start date: 12/20/1994    Quit date: 06/09/2015    Years since quitting: 3.1  . Smokeless tobacco: Never Used  Substance and Sexual Activity  . Alcohol use: Yes    Alcohol/week: 2.0 standard drinks    Types: 2 Glasses of wine per week  . Drug use: No  . Sexual activity: Yes    Partners: Male    Birth control/protection: None    Comment: husband has a vasectomy  Lifestyle  . Physical activity    Days per week: 3 days    Minutes per session: 60 min  . Stress: Only a little   Relationships  . Social connections    Talks on phone: More than three times a week    Gets together: More than three times a week    Attends religious service: Never    Active member of club or organization: No    Attends meetings of clubs  or organizations: Never    Relationship status: Married  . Intimate partner violence    Fear of current or ex partner: No    Emotionally abused: No    Physically abused: No    Forced sexual activity: No  Other Topics Concern  . Not on file  Social History Narrative  . Not on file     Current Outpatient Medications:  .  acetaminophen (TYLENOL) 500 MG tablet, Take 500 mg by mouth every 6 (six) hours as needed., Disp: , Rfl:  .  Biotin 10 MG CAPS, Take 1 capsule by mouth daily., Disp: , Rfl:  .  calcium-vitamin D (OSCAL WITH D) 500-200 MG-UNIT tablet, Take 1 tablet by mouth., Disp: , Rfl:  .  diclofenac (VOLTAREN) 75 MG EC tablet, Take 1 tablet (75 mg total) by mouth 2 (two) times daily., Disp: 60 tablet, Rfl: 3 .  ibuprofen (ADVIL,MOTRIN) 600 MG tablet, Take 1 tablet (600 mg total) by mouth every 6 (six) hours as needed. Take with food; stop if abd pain or blood in stool, Disp: 40 tablet, Rfl: 0 .  Multiple Vitamin (MULTIVITAMIN) tablet, Take 1 tablet by mouth daily., Disp: , Rfl:  .  nitrofurantoin, macrocrystal-monohydrate, (MACROBID) 100 MG capsule, Take 1 capsule (100 mg total) by mouth 2 (two) times daily. (Patient not taking: Reported on 08/19/2018), Disp: 10 capsule, Rfl: 0  No Known Allergies   Review of Systems  Constitutional: Negative for chills, fever and malaise/fatigue.  HENT: Negative for congestion, sinus pain and sore throat.   Eyes: Negative for blurred vision.  Respiratory: Negative for cough and shortness of breath.   Cardiovascular: Negative for chest pain, palpitations and leg swelling.  Gastrointestinal: Negative for abdominal pain, constipation, diarrhea and nausea.  Genitourinary: Negative for dysuria.   Musculoskeletal: Negative for falls and joint pain.  Skin: Negative for rash.  Neurological: Negative for dizziness and headaches.  Endo/Heme/Allergies: Negative for polydipsia.  Psychiatric/Behavioral: The patient is not nervous/anxious and does not have insomnia.      Objective  Vitals:   08/19/18 1342  BP: 126/72  Pulse: 99  Resp: 16  Temp: (!) 97.5 F (36.4 C)  TempSrc: Temporal  SpO2: 99%  Weight: 142 lb 1.6 oz (64.5 kg)  Height: 5' 5"  (1.651 m)    Body mass index is 23.65 kg/m.  Physical Exam  Constitutional: Patient appears well-developed and well-nourished. No distress.  HENT: Head: Normocephalic and atraumatic. Ears: B TMs ok, no erythema or effusion; Nose: Nose normal. Mouth/Throat: Oropharynx is clear and moist. No oropharyngeal exudate.  Eyes: Conjunctivae and EOM are normal. Pupils are equal, round, and reactive to light. No scleral icterus.  nystagmus noted- chronic.  Neck: Normal range of motion. Neck supple. No JVD present. No thyromegaly present.  Cardiovascular: Normal rate, regular rhythm and normal heart sounds.  No murmur heard. No BLE edema. Pulmonary/Chest: Effort normal and breath sounds normal. No respiratory distress. Abdominal: Soft. Bowel sounds are normal, no distension. There is no tenderness. no masses Breast: no lumps or masses, no nipple discharge or rashes FEMALE GENITALIA:  External genitalia normal External urethra normal Vaginal vault normal without discharge or lesions Cervix partially visualized- normal without discharge or lesions Bimanual exam normal without masses RECTAL: no rectal masses or hemorrhoids Musculoskeletal: Normal range of motion, no joint effusions. No gross deformities Neurological: he is alert and oriented to person, place, and time. No cranial nerve deficit. Coordination, balance, strength, speech and gait are normal.  Skin: Skin is warm and dry.  No rash noted. No erythema.  Psychiatric: Patient has a normal  mood and affect. behavior is normal. Judgment and thought content normal.     No results found for this or any previous visit (from the past 2160 hour(s)).    Fall Risk: Fall Risk  08/19/2018 03/12/2018 03/04/2018 02/25/2018 02/11/2018  Falls in the past year? 0 0 0 0 0  Number falls in past yr: 0 - 0 - 0  Injury with Fall? 0 - 0 - -  Follow up - - Falls evaluation completed - -     Functional Status Survey: Is the patient deaf or have difficulty hearing?: No Does the patient have difficulty seeing, even when wearing glasses/contacts?: No Does the patient have difficulty concentrating, remembering, or making decisions?: No Does the patient have difficulty walking or climbing stairs?: No Does the patient have difficulty dressing or bathing?: No Does the patient have difficulty doing errands alone such as visiting a doctor's office or shopping?: No  Assessment & Plan  1. Well woman exam  Discussed diet and exercise recommendations, increase water intake. Stress reduction. Shared decision making utilized- no labs today.   2. Screening for breast cancer Reviewed results and recommendations   3. Screening for cervical cancer - Cytology - PAP  -USPSTF grade A and B recommendations reviewed with patient; age-appropriate recommendations, preventive care, screening tests, etc discussed and encouraged; healthy living encouraged; see AVS for patient education given to patient -Discussed importance of 150 minutes of physical activity weekly, eat two servings of fish weekly, eat one serving of tree nuts ( cashews, pistachios, pecans, almonds.Marland Kitchen) every other day, eat 6 servings of fruit/vegetables daily and drink plenty of water and avoid sweet beverages.   -Reviewed Health Maintenance: flu shot in september

## 2018-08-19 NOTE — Patient Instructions (Signed)
General recommendations: 150 minutes of physical activity weekly, eat two servings of fish weekly, eat one serving of tree nuts ( cashews, pistachios, pecans, almonds.Marland Kitchen) every other day, eat 6 servings of fruit/vegetables daily and drink plenty of water and avoid sweet beverages. Recommend at least 64 ounces of water daily.   Stay Safe in the [place] The majority of sun exposure occurs before age 45 and skin cancer can take 20 years or more to develop. Whether your sun bathing days are behind you or you still spend time pursuing the perfect tan, you should be concerned about skin cancer.  Remember, the sun's ultraviolet (UV) rays can reflect off water, sand, concrete and snow, and can reach below the water's surface. Certain types of UV light penetrate fog and clouds, so it's possible to get sunburn even on overcast days.  Avoid direct sunlight as much as possible during the peak sun hours, generally 10 a.m. to 3 p.m., or seek shade during this part of day. Wear broad-spectrum sunscreen - with an SPF of at least 30 - containing both UVA and UVB protection. Look for ingredients like [employer] (also known as avobenzone) or titanium dioxide on the label. Reapply sunscreen frequently, at least every two hours when outdoors, especially if you perspire or you've been swimming. Your best bet is to choose water-resistant products that are more likely to stay on your skin. Wear lip balm with an SPF 15 or higher. Wear a hat and other protective clothing while in the sun. Tightly woven fibers and darker clothing generally provide more protection. Also, look for products approved by the American Academy of Dermatology. Wear UV-protective sunglasses.

## 2018-08-20 LAB — CYTOLOGY - PAP: Diagnosis: NEGATIVE

## 2018-09-23 ENCOUNTER — Telehealth: Payer: Self-pay | Admitting: Student in an Organized Health Care Education/Training Program

## 2018-09-23 NOTE — Telephone Encounter (Signed)
Patient last had a B/L facet in March and wants to have another. Should we schedule follow up or go ahead with procedure

## 2018-09-23 NOTE — Telephone Encounter (Signed)
Spoke with patient.  She states she got some relief from the last injection, BLF and would like to schedule another one.  Patient has a standing order in for this procedure.  Pre procedure instructions explained to patient.  Please schedule an appointment for a Bilateral Lumbar facet block since there is a standing order in and this is what the patientt requests.  She did a follow up appointment after the last procedure.  Thank you

## 2018-09-26 IMAGING — MG MM DIGITAL SCREENING BILAT W/ CAD
4 series · 4 of 4 positions shown · non-contrast
Comparison: Previous exam(s).

CLINICAL DATA: Screening.

EXAM:
DIGITAL SCREENING BILATERAL MAMMOGRAM WITH CAD

[R CC]
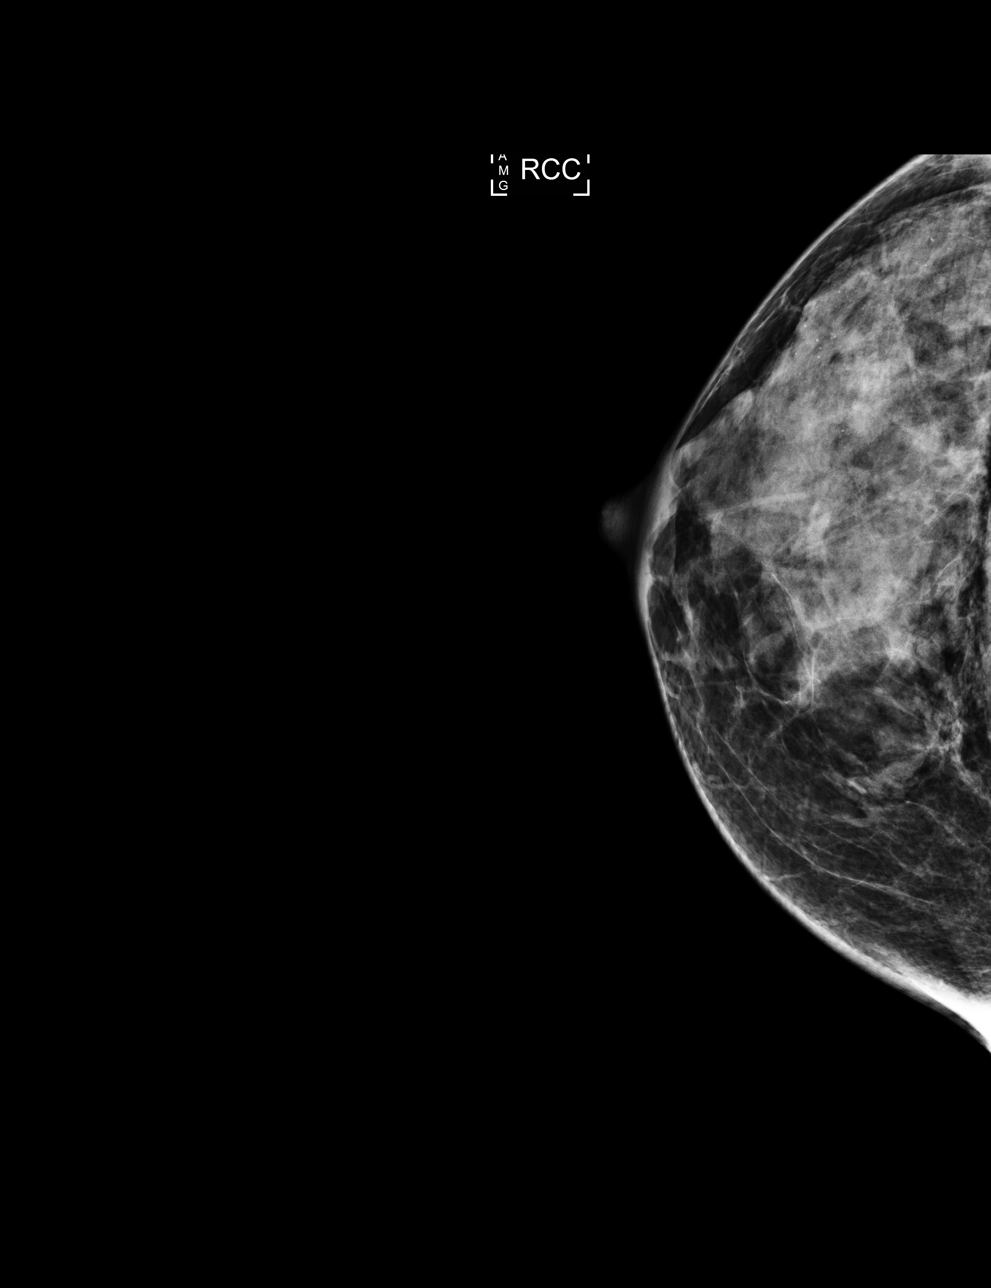

[L CC]
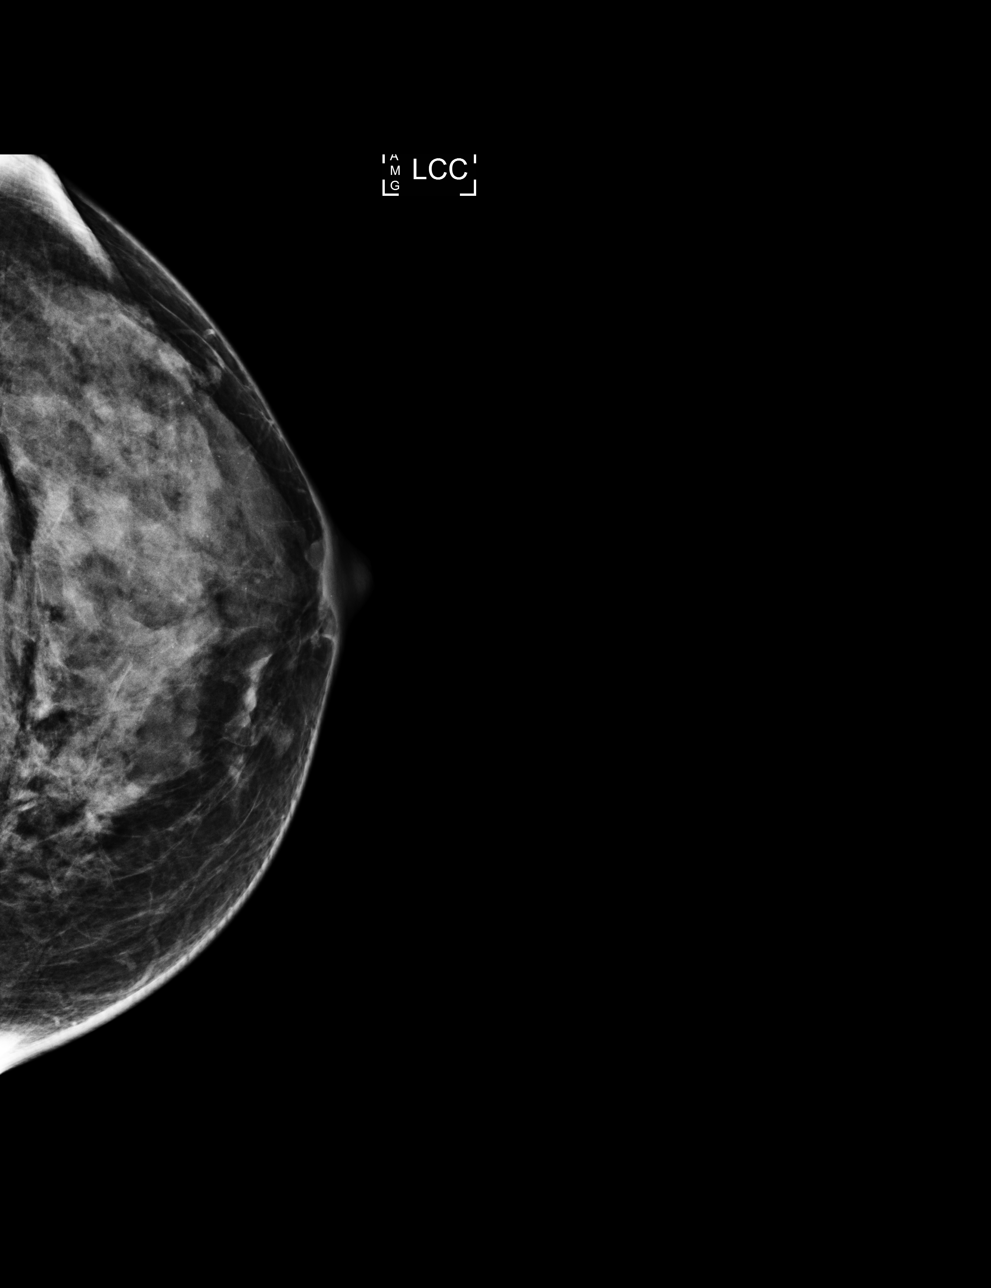

[L MLO]
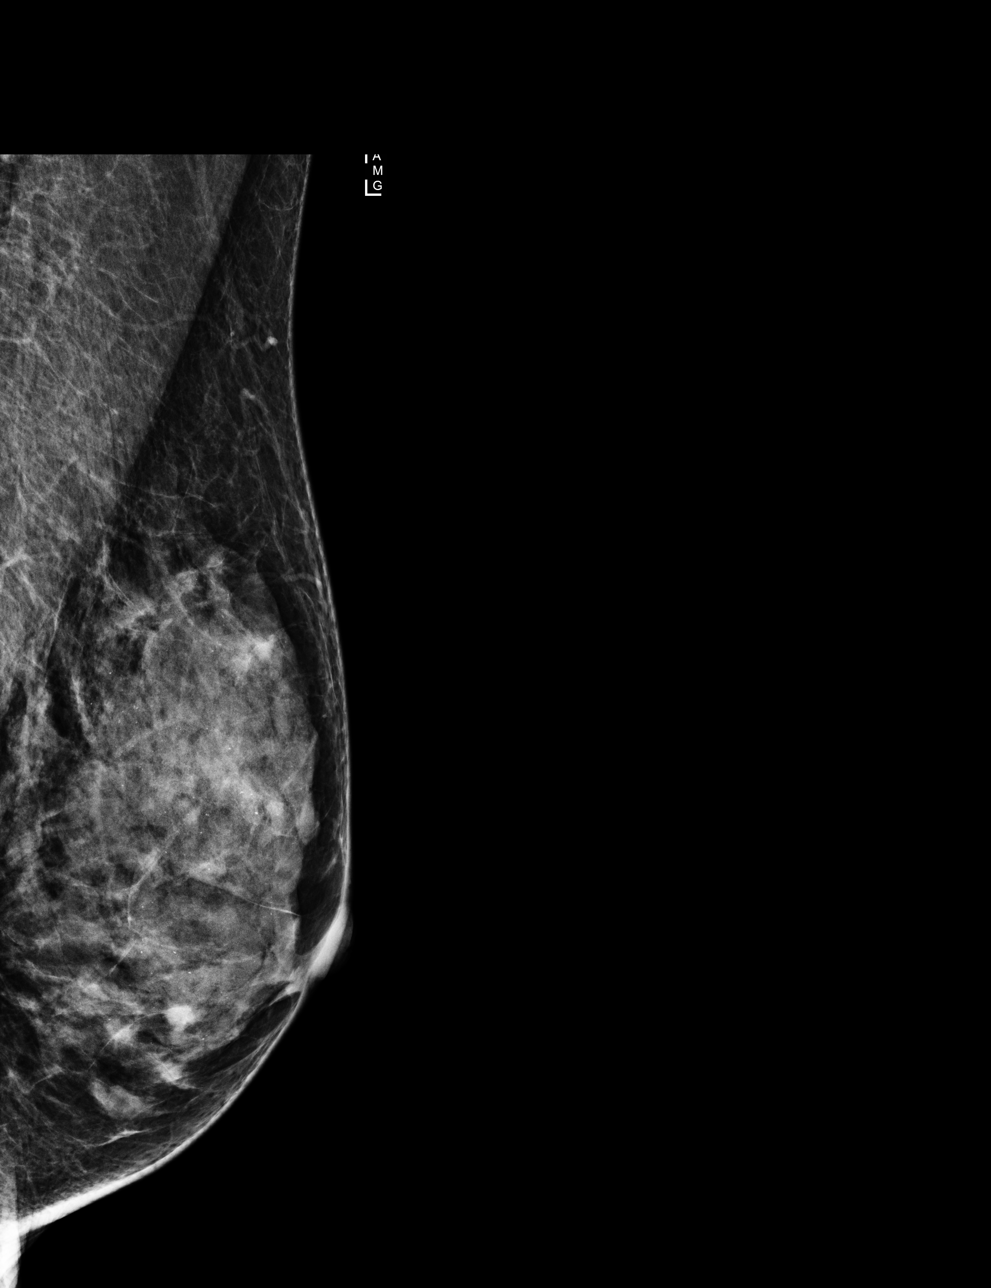

[R MLO]
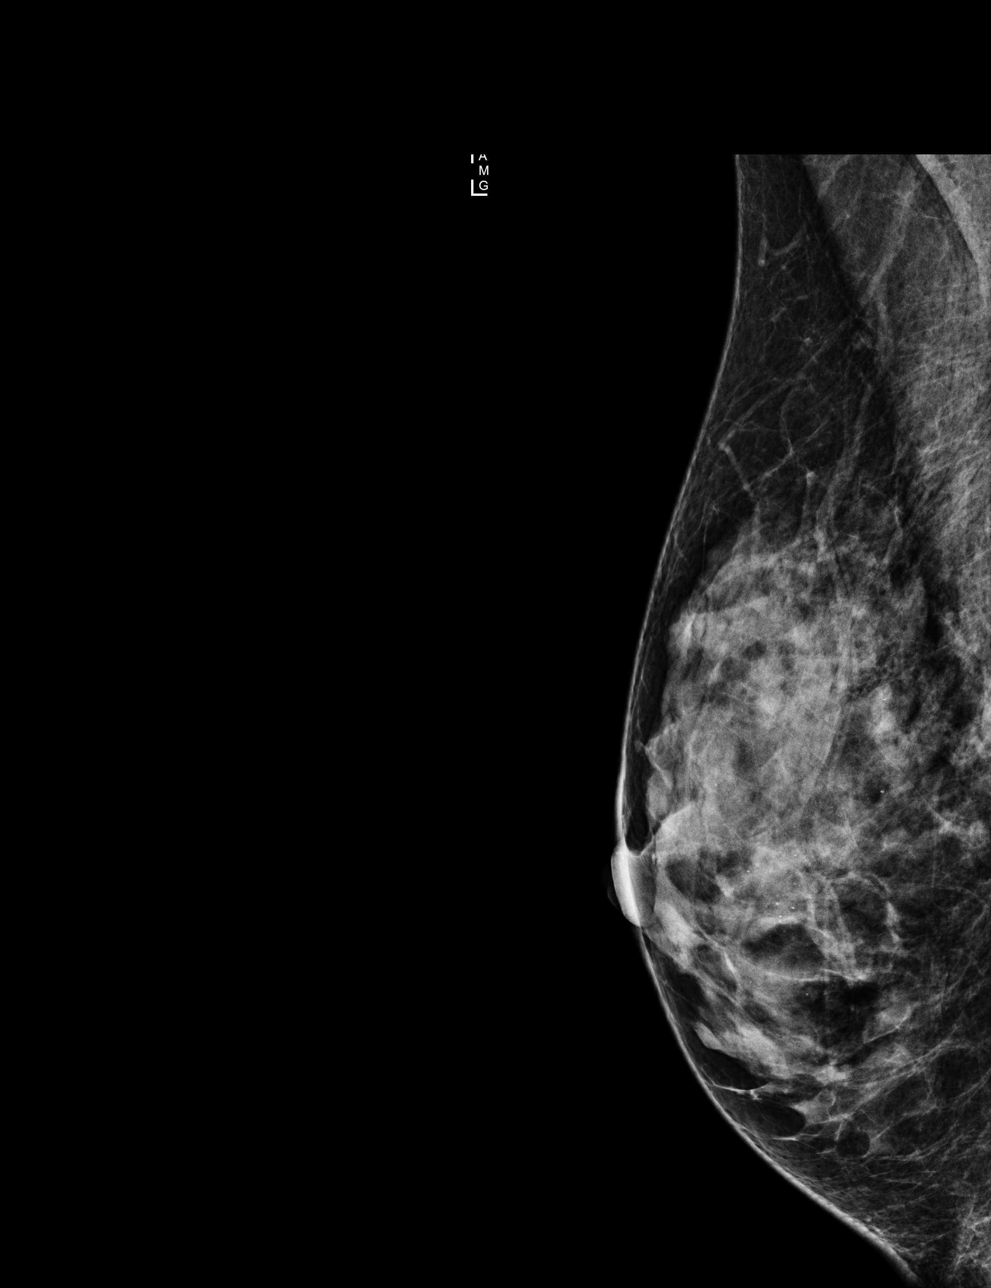

[4 of 4 positions shown; findings below may reference images not displayed]

ACR Breast Density Category d: The breast tissue is extremely dense,
which lowers the sensitivity of mammography.
FINDINGS: In the left breast, a possible mass warrants further evaluation. In
the right breast, no findings suspicious for malignancy.

Images were processed with CAD.
IMPRESSION: Further evaluation is suggested for possible mass in the left
breast.

RECOMMENDATION:
Diagnostic mammogram and possibly ultrasound of the left breast.
(Code:K5-5-HHA)

The patient will be contacted regarding the findings, and additional
imaging will be scheduled.

BI-RADS CATEGORY  0: Incomplete. Need additional imaging evaluation
and/or prior mammograms for comparison.

## 2018-09-29 ENCOUNTER — Ambulatory Visit: Payer: BC Managed Care – PPO | Admitting: Student in an Organized Health Care Education/Training Program

## 2018-10-01 ENCOUNTER — Encounter: Payer: Self-pay | Admitting: Student in an Organized Health Care Education/Training Program

## 2018-10-01 ENCOUNTER — Ambulatory Visit (HOSPITAL_BASED_OUTPATIENT_CLINIC_OR_DEPARTMENT_OTHER): Payer: BC Managed Care – PPO | Admitting: Student in an Organized Health Care Education/Training Program

## 2018-10-01 ENCOUNTER — Ambulatory Visit
Admission: RE | Admit: 2018-10-01 | Discharge: 2018-10-01 | Disposition: A | Discharge status: Home / Self Care | Payer: BC Managed Care – PPO | Source: Ambulatory Visit | Attending: Student in an Organized Health Care Education/Training Program | Admitting: Student in an Organized Health Care Education/Training Program

## 2018-10-01 ENCOUNTER — Other Ambulatory Visit: Payer: Self-pay

## 2018-10-01 VITALS — BP 119/82 | HR 78 | Temp 98.0°F | Resp 16 | Ht 65.0 in | Wt 142.0 lb

## 2018-10-01 DIAGNOSIS — M47816 Spondylosis without myelopathy or radiculopathy, lumbar region: Secondary | ICD-10-CM

## 2018-10-01 MED ORDER — FENTANYL CITRATE (PF) 100 MCG/2ML IJ SOLN
INTRAMUSCULAR | Status: AC
  Filled 2018-10-01: qty 2

## 2018-10-01 MED ORDER — ONDANSETRON HCL 4 MG/2ML IJ SOLN
4.0000 mg | Freq: Once | INTRAMUSCULAR | Status: AC
  Administered 2018-10-01: 4 mg via INTRAVENOUS

## 2018-10-01 MED ORDER — LIDOCAINE HCL 2 % IJ SOLN
20.0000 mL | Freq: Once | INTRAMUSCULAR | Status: AC
  Administered 2018-10-01: 400 mg

## 2018-10-01 MED ORDER — DEXAMETHASONE SODIUM PHOSPHATE 10 MG/ML IJ SOLN
10.0000 mg | Freq: Once | INTRAMUSCULAR | Status: AC
  Administered 2018-10-01: 10 mg

## 2018-10-01 MED ORDER — FENTANYL CITRATE (PF) 100 MCG/2ML IJ SOLN
25.0000 ug | INTRAMUSCULAR | Status: DC | PRN
  Administered 2018-10-01: 50 ug via INTRAVENOUS

## 2018-10-01 MED ORDER — MIDAZOLAM HCL 5 MG/5ML IJ SOLN
1.0000 mg | INTRAMUSCULAR | Status: DC | PRN
  Administered 2018-10-01: 1 mg via INTRAVENOUS

## 2018-10-01 MED ORDER — MIDAZOLAM HCL 5 MG/5ML IJ SOLN
INTRAMUSCULAR | Status: AC
  Filled 2018-10-01: qty 5

## 2018-10-01 MED ORDER — ONDANSETRON HCL 4 MG/2ML IJ SOLN
INTRAMUSCULAR | Status: AC
  Filled 2018-10-01: qty 2

## 2018-10-01 MED ORDER — DEXAMETHASONE SODIUM PHOSPHATE 10 MG/ML IJ SOLN
INTRAMUSCULAR | Status: AC
  Filled 2018-10-01: qty 2

## 2018-10-01 MED ORDER — LIDOCAINE HCL 2 % IJ SOLN
INTRAMUSCULAR | Status: AC
  Filled 2018-10-01: qty 20

## 2018-10-01 MED ORDER — ROPIVACAINE HCL 2 MG/ML IJ SOLN
1.0000 mL | Freq: Once | INTRAMUSCULAR | Status: AC
  Administered 2018-10-01: 1 mL via EPIDURAL

## 2018-10-01 MED ORDER — ROPIVACAINE HCL 2 MG/ML IJ SOLN
INTRAMUSCULAR | Status: AC
  Filled 2018-10-01: qty 20

## 2018-10-01 MED ORDER — ROPIVACAINE HCL 2 MG/ML IJ SOLN
2.0000 mL | Freq: Once | INTRAMUSCULAR | Status: AC
  Administered 2018-10-01: 2 mL via EPIDURAL

## 2018-10-01 NOTE — Patient Instructions (Signed)

## 2018-10-01 NOTE — Progress Notes (Signed)
Patient's Name: Kaitlyn Harvey  MRN: [id number]  Referring Provider: Gillis Santa, MD  DOB: 06-29-1975  PCP: Arnetha Courser, MD  DOS: 10/01/2018  Note by: Gillis Santa, MD  Service setting: Ambulatory outpatient  Specialty: Interventional Pain Management  Patient type: Established  Location: [facility] (AMB) Pain Management Facility  Visit type: Interventional Procedure   Primary Reason for Visit: Interventional Pain Management Treatment. CC: Back Pain (lower bilateral )  Procedure:          Anesthesia, Analgesia, Anxiolysis:  Type: Lumbar Facet, Medial Branch Block(s) #2   (#1 done 03/12/18)  Primary Purpose: Diagnostic Region: Posterolateral Lumbosacral Spine Level:  L3, L4, L5,  Medial Branch Level(s). Injecting these levels blocks the L3-4, L4-5, lumbar facet joints. Laterality: Bilateral  Type: Moderate (Conscious) Sedation combined with Local Anesthesia Indication(s): Analgesia and Anxiety Route: Intravenous (IV) IV Access: Secured Sedation: Meaningful verbal contact was maintained at all times during the procedure  Local Anesthetic: Lidocaine 1-2%  Position: Prone   Indications: 1. Lumbar facet arthropathy   2. Lumbar spondylosis    Pain Score: Pre-procedure: 5 /10 Post-procedure: 0-No pain/10  Pre-op Assessment:  Kaitlyn Harvey is a 43 y.o. (year old), female patient, seen today for interventional treatment. She  has a past surgical history that includes Upper gi endoscopy and Upper gastrointestinal endoscopy (2008). Kaitlyn Harvey has a current medication list which includes the following prescription(s): acetaminophen, biotin, calcium-vitamin d, ibuprofen, multivitamin, and diclofenac, and the following Facility-Administered Medications: fentanyl and midazolam. Her primarily concern today is the Back Pain (lower bilateral )  Initial Vital Signs:  Pulse/HCG Rate: (!) 58ECG Heart Rate: (!) 58 Temp: 97.9 F (36.6 C) Resp: 16 BP: 119/87 SpO2: 100 %  BMI: Estimated body mass index is 23.63  kg/m as calculated from the following:   Height as of this encounter: 5' 5"  (1.651 m).   Weight as of this encounter: 142 lb (64.4 kg).  Risk Assessment: Allergies: Reviewed. She has No Known Allergies.  Allergy Precautions: None required Coagulopathies: Reviewed. None identified.  Blood-thinner therapy: None at this time Active Infection(s): Reviewed. None identified. Kaitlyn Harvey is afebrile  Site Confirmation: Kaitlyn Harvey was asked to confirm the procedure and laterality before marking the site Procedure checklist: Completed Consent: Before the procedure and under the influence of no sedative(s), amnesic(s), or anxiolytics, the patient was informed of the treatment options, risks and possible complications. To fulfill our ethical and legal obligations, as recommended by the American Medical Association's Code of Ethics, I have informed the patient of my clinical impression; the nature and purpose of the treatment or procedure; the risks, benefits, and possible complications of the intervention; the alternatives, including doing nothing; the risk(s) and benefit(s) of the alternative treatment(s) or procedure(s); and the risk(s) and benefit(s) of doing nothing. The patient was provided information about the general risks and possible complications associated with the procedure. These may include, but are not limited to: failure to achieve desired goals, infection, bleeding, organ or nerve damage, allergic reactions, paralysis, and death. In addition, the patient was informed of those risks and complications associated to Spine-related procedures, such as failure to decrease pain; infection (i.e.: Meningitis, epidural or intraspinal abscess); bleeding (i.e.: epidural hematoma, subarachnoid hemorrhage, or any other type of intraspinal or peri-dural bleeding); organ or nerve damage (i.e.: Any type of peripheral nerve, nerve root, or spinal cord injury) with subsequent damage to sensory, motor, and/or  autonomic systems, resulting in permanent pain, numbness, and/or weakness of one or several areas of the body;  allergic reactions; (i.e.: anaphylactic reaction); and/or death. Furthermore, the patient was informed of those risks and complications associated with the medications. These include, but are not limited to: allergic reactions (i.e.: anaphylactic or anaphylactoid reaction(s)); adrenal axis suppression; blood sugar elevation that in diabetics may result in ketoacidosis or comma; water retention that in patients with history of congestive heart failure may result in shortness of breath, pulmonary edema, and decompensation with resultant heart failure; weight gain; swelling or edema; medication-induced neural toxicity; particulate matter embolism and blood vessel occlusion with resultant organ, and/or nervous system infarction; and/or aseptic necrosis of one or more joints. Finally, the patient was informed that Medicine is not an exact science; therefore, there is also the possibility of unforeseen or unpredictable risks and/or possible complications that may result in a catastrophic outcome. The patient indicated having understood very clearly. We have given the patient no guarantees and we have made no promises. Enough time was given to the patient to ask questions, all of which were answered to the patient's satisfaction. Kaitlyn Harvey has indicated that she wanted to continue with the procedure. Attestation: I, the ordering provider, attest that I have discussed with the patient the benefits, risks, side-effects, alternatives, likelihood of achieving goals, and potential problems during recovery for the procedure that I have provided informed consent. Date   Time:   Pre-Procedure Preparation:  Monitoring: As per clinic protocol. Respiration, ETCO2, SpO2, BP, heart rate and rhythm monitor placed and checked for adequate function Safety Precautions: Patient was assessed for positional comfort and  pressure points before starting the procedure. Time-out: I initiated and conducted the "Time-out" before starting the procedure, as per protocol. The patient was asked to participate by confirming the accuracy of the "Time Out" information. Verification of the correct person, site, and procedure were performed and confirmed by me, the nursing staff, and the patient. "Time-out" conducted as per Joint Commission's Universal Protocol (UP.01.01.01). Time: 0923  Description of Procedure:          Laterality: Bilateral. The procedure was performed in identical fashion on both sides. Levels:  L3, L4, L5,  Medial Branch Level(s) Area Prepped: Posterior Lumbosacral Region Prepping solution: ChloraPrep (2% chlorhexidine gluconate and 70% isopropyl alcohol) Safety Precautions: Aspiration looking for blood return was conducted prior to all injections. At no point did we inject any substances, as a needle was being advanced. Before injecting, the patient was told to immediately notify me if she was experiencing any new onset of "ringing in the ears, or metallic taste in the mouth". No attempts were made at seeking any paresthesias. Safe injection practices and needle disposal techniques used. Medications properly checked for expiration dates. SDV (single dose vial) medications used. After the completion of the procedure, all disposable equipment used was discarded in the proper designated medical waste containers. Local Anesthesia: Protocol guidelines were followed. The patient was positioned over the fluoroscopy table. The area was prepped in the usual manner. The time-out was completed. The target area was identified using fluoroscopy. A 12-in long, straight, sterile hemostat was used with fluoroscopic guidance to locate the targets for each level blocked. Once located, the skin was marked with an approved surgical skin marker. Once all sites were marked, the skin (epidermis, dermis, and hypodermis), as well as  deeper tissues (fat, connective tissue and muscle) were infiltrated with a small amount of a short-acting local anesthetic, loaded on a 10cc syringe with a 25G, 1.5-in  Needle. An appropriate amount of time was allowed for local anesthetics to  take effect before proceeding to the next step. Local Anesthetic: Lidocaine 2.0% The unused portion of the local anesthetic was discarded in the proper designated containers. Technical explanation of process:   L3 Medial Branch Nerve Block (MBB): The target area for the L3 medial branch is at the junction of the postero-lateral aspect of the superior articular process and the superior, posterior, and medial edge of the transverse process of L4. Under fluoroscopic guidance, a Quincke needle was inserted until contact was made with os over the superior postero-lateral aspect of the pedicular shadow (target area). After negative aspiration for blood,18m of the nerve block solution was injected without difficulty or complication. The needle was removed intact. L4 Medial Branch Nerve Block (MBB): The target area for the L4 medial branch is at the junction of the postero-lateral aspect of the superior articular process and the superior, posterior, and medial edge of the transverse process of L5. Under fluoroscopic guidance, a Quincke needle was inserted until contact was made with os over the superior postero-lateral aspect of the pedicular shadow (target area). After negative aspiration for blood, 126mof the nerve block solution was injected without difficulty or complication. The needle was removed intact. L5 Medial Branch Nerve Block (MBB): The target area for the L5 medial branch is at the junction of the postero-lateral aspect of the superior articular process and the superior, posterior, and medial edge of the sacral ala. Under fluoroscopic guidance, a Quincke needle was inserted until contact was made with os over the superior postero-lateral aspect of the pedicular  shadow (target area). After negative aspiration for blood, 38m62mf the nerve block solution was injected without difficulty or complication. The needle was removed intact.   Nerve block solution: 6 cc solution made of 4 cc of 0.2% ropivacaine, 2 cc of Decadron 10 mg/cc, total steroid dose 20 mg Decadron   Procedural Needles: 22-gauge, 3.5-inch, Quincke needles used for all levels.  Once the entire procedure was completed, the treated area was cleaned, making sure to leave some of the prepping solution back to take advantage of its long term bactericidal properties.   Illustration of the posterior view of the lumbar spine and the posterior neural structures. Laminae of L2 through S1 are labeled. DPRL5, dorsal primary ramus of L5; DPRS1, dorsal primary ramus of S1; DPR3, dorsal primary ramus of L3; FJ, facet (zygapophyseal) joint L3-L4; I, inferior articular process of L4; LB1, lateral branch of dorsal primary ramus of L1; IAB, inferior articular branches from L3 medial branch (supplies L4-L5 facet joint); IBP, intermediate branch plexus; MB3, medial branch of dorsal primary ramus of L3; NR3, third lumbar nerve root; S, superior articular process of L5; SAB, superior articular branches from L4 (supplies L4-5 facet joint also); TP3, transverse process of L3.  Vitals:   10/01/18 0938 10/01/18 0945 10/01/18 0955 10/01/18 1005  BP: (!) 102/59 115/81 125/90 119/82  Pulse: 78     Resp: 16 16 14 16   Temp:  98 F (36.7 C)    TempSrc:      SpO2: 100% 100% 100% 100%  Weight:      Height:         Start Time: 0923 hrs. End Time: 0938 hrs.  Imaging Guidance (Spinal):          Type of Imaging Technique: Fluoroscopy Guidance (Spinal) Indication(s): Assistance in needle guidance and placement for procedures requiring needle placement in or near specific anatomical locations not easily accessible without such assistance. Exposure Time: Please see nurses notes.  Contrast: None used. Fluoroscopic Guidance:  I was personally present during the use of fluoroscopy. "Tunnel Vision Technique" used to obtain the best possible view of the target area. Parallax error corrected before commencing the procedure. "Direction-depth-direction" technique used to introduce the needle under continuous pulsed fluoroscopy. Once target was reached, antero-posterior, oblique, and lateral fluoroscopic projection used confirm needle placement in all planes. Images permanently stored in EMR. Interpretation: No contrast injected. I personally interpreted the imaging intraoperatively. Adequate needle placement confirmed in multiple planes. Permanent images saved into the patient's record.  Antibiotic Prophylaxis:   Anti-infectives (From admission, onward)   None     Indication(s): None identified  Post-operative Assessment:  Post-procedure Vital Signs:  Pulse/HCG Rate: 7864 Temp: 98 F (36.7 C) Resp: 16 BP: 119/82 SpO2: 100 %  EBL: None  Complications: No immediate post-treatment complications observed by team, or reported by patient.  Note: The patient tolerated the entire procedure well. A repeat set of vitals were taken after the procedure and the patient was kept under observation following institutional policy, for this type of procedure. Post-procedural neurological assessment was performed, showing return to baseline, prior to discharge. The patient was provided with post-procedure discharge instructions, including a section on how to identify potential problems. Should any problems arise concerning this procedure, the patient was given instructions to immediately contact us, at any time, without hesitation. In any case, we plan to contact the patient by telephone for a follow-up status report regarding this interventional procedure.  Comments:  No additional relevant information.  Plan of Care  Orders:  Orders Placed This Encounter  Procedures   DG PAIN CLINIC C-ARM 1-60 MIN NO REPORT    Intraoperative  interpretation by procedural physician at [place].    Standing Status:   Standing    Number of Occurrences:   1    Order Specific Question:   Reason for exam:    Answer:   Assistance in needle guidance and placement for procedures requiring needle placement in or near specific anatomical locations not easily accessible without such assistance.   Medications ordered for procedure: Meds ordered this encounter  Medications   lidocaine (XYLOCAINE) 2 % (with pres) injection 400 mg   midazolam (VERSED) 5 MG/5ML injection 1-2 mg    Make sure Flumazenil is available in the pyxis when using this medication. If oversedation occurs, administer 0.2 mg IV over 15 sec. If after 45 sec no response, administer 0.2 mg again over 1 min; may repeat at 1 min intervals; not to exceed 4 doses (1 mg)   fentaNYL (SUBLIMAZE) injection 25-50 mcg    Make sure Narcan is available in the pyxis when using this medication. In the event of respiratory depression (RR< 8/min): Titrate NARCAN (naloxone) in increments of 0.1 to 0.2 mg IV at 2-3 minute intervals, until desired degree of reversal.   dexamethasone (DECADRON) injection 10 mg   ropivacaine (PF) 2 mg/mL (0.2%) (NAROPIN) injection 1 mL   dexamethasone (DECADRON) injection 10 mg   ropivacaine (PF) 2 mg/mL (0.2%) (NAROPIN) injection 2 mL   ondansetron (ZOFRAN) injection 4 mg   Medications administered: We administered lidocaine, midazolam, fentaNYL, dexamethasone, ropivacaine (PF) 2 mg/mL (0.2%), dexamethasone, ropivacaine (PF) 2 mg/mL (0.2%), and ondansetron.  See the medical record for exact dosing, route, and time of administration.  Disposition: Discharge home  Discharge Date & Time: 10/01/2018; 1007 hrs.   Follow-up plan:   Return in about 5 weeks (around 11/05/2018) for Post Procedure Evaluation, virtual.  Future Appointments  Date Time Provider [place]  10/15/2018  3:45 PM [place] [facility]  11/05/2018  1:45  PM Gillis Santa, MD [facility] None  08/25/2019  2:40 PM Delsa Grana, PA-C [facility]   Primary Care Physician: Arnetha Courser, MD Location: [facility] Note by: Gillis Santa, MD Date: 10/01/2018; Time: 3:51 PM  Disclaimer:  Medicine is not an exact science. The only guarantee in medicine is that nothing is guaranteed. It is important to note that the decision to proceed with this intervention was based on the information collected from the patient. The Data and conclusions were drawn from the patient's questionnaire, the interview, and the physical examination. Because the information was provided in large part by the patient, it cannot be guaranteed that it has not been purposely or unconsciously manipulated. Every effort has been made to obtain as much relevant data as possible for this evaluation. It is important to note that the conclusions that lead to this procedure are derived in large part from the available data. Always take into account that the treatment will also be dependent on availability of resources and existing treatment guidelines, considered by other Pain Management Practitioners as being common knowledge and practice, at the time of the intervention. For Medico-Legal purposes, it is also important to point out that variation in procedural techniques and pharmacological choices are the acceptable norm. The indications, contraindications, technique, and results of the above procedure should only be interpreted and judged by a Board-Certified Interventional Pain Specialist with extensive familiarity and expertise in the same exact procedure and technique.

## 2018-10-01 NOTE — Progress Notes (Signed)
Safety precautions to be maintained throughout the outpatient stay will include: orient to surroundings, keep bed in low position, maintain call bell within reach at all times, provide assistance with transfer out of bed and ambulation.  

## 2018-10-02 ENCOUNTER — Telehealth: Payer: Self-pay

## 2018-10-02 NOTE — Telephone Encounter (Signed)
Denies any needs at this time. States she is doing well and no problems. Instructed to call if needed.

## 2018-10-15 ENCOUNTER — Ambulatory Visit (INDEPENDENT_AMBULATORY_CARE_PROVIDER_SITE_OTHER): Payer: BC Managed Care – PPO

## 2018-10-15 ENCOUNTER — Other Ambulatory Visit: Payer: Self-pay

## 2018-10-15 DIAGNOSIS — Z23 Encounter for immunization: Secondary | ICD-10-CM

## 2018-11-04 ENCOUNTER — Encounter: Payer: Self-pay | Admitting: Student in an Organized Health Care Education/Training Program

## 2018-11-05 ENCOUNTER — Ambulatory Visit
Payer: BC Managed Care – PPO | Attending: Student in an Organized Health Care Education/Training Program | Admitting: Student in an Organized Health Care Education/Training Program

## 2018-11-05 ENCOUNTER — Encounter: Payer: Self-pay | Admitting: Student in an Organized Health Care Education/Training Program

## 2018-11-05 ENCOUNTER — Other Ambulatory Visit: Payer: Self-pay

## 2018-11-05 DIAGNOSIS — M5136 Other intervertebral disc degeneration, lumbar region: Secondary | ICD-10-CM

## 2018-11-05 DIAGNOSIS — M47816 Spondylosis without myelopathy or radiculopathy, lumbar region: Secondary | ICD-10-CM | POA: Diagnosis not present

## 2018-11-05 NOTE — Progress Notes (Signed)
Pain Management Virtual Encounter Note - Virtual Visit via [place] (real-time audio visits between healthcare provider and patient).   Patient's Phone No. & Preferred Pharmacy:  [phone] (home); [phone] (mobile); (Preferred) [phone] No e-mail address on record  [id number] [id number]: [phone]: [phone]   Pre-screening note:  Our staff contacted Kaitlyn Harvey and offered her an "in person", "face-to-face" appointment versus a telephone encounter. She indicated preferring the telephone encounter, at this time.   Reason for Virtual Visit: COVID-19*  Social distancing based on CDC and AMA recommendations.   I contacted AMichelyn Scullinon 11/05/2018 via video conference.      I clearly identified myself as BGillis Santa Harvey. I verified that I was speaking with the correct person using two identifiers (Name: Kaitlyn Harvey and date of birth: [id number].  Advanced Informed Consent I sought verbal advanced consent from Kaitlyn Slimfor virtual visit interactions. I informed Kaitlyn Harvey of possible security and privacy concerns, risks, and limitations associated with providing "not-in-person" medical evaluation and management services. I also informed Kaitlyn Harvey of the availability of "in-person" appointments. Finally, I informed her that there would be a charge for the virtual visit and that she could be  personally, fully or partially, financially responsible for it. Kaitlyn Harvey understanding and agreed to proceed.   Historic Elements   Ms. Kaitlyn Ortmannis a 43y.o. year old, female patient evaluated today after her last encounter by our practice on 10/02/2018. Kaitlyn Harvey has a past medical history of Arthritis and Thoracic compression fracture, sequela (12/24/2017). She also  has a past surgical history that includes Upper gi endoscopy and Upper gastrointestinal endoscopy (2008). Ms.  Harvey a current medication list which includes the following prescription(s): acetaminophen, biotin, calcium-vitamin d, ibuprofen, multivitamin, and diclofenac. She  reports that she quit smoking about 3 years ago. Her smoking use included cigarettes. She started smoking about 23 years ago. She has a 10.00 pack-year smoking history. She has never used smokeless tobacco. She reports current alcohol use of about 2.0 standard drinks of alcohol per week. She reports that she does not use drugs. Kaitlyn Harvey No Known Allergies.   HPI  Today, she is being contacted for a post-procedure assessment.  Evaluation of last interventional procedure  10/01/2018 Procedure:  Type: Lumbar Facet, Medial Branch Block(s) #2   (#1 done 03/12/18)  Primary Purpose: Diagnostic Region: Posterolateral Lumbosacral Spine Level:  L3, L4, L5,  Medial Branch Level(s). Injecting these levels blocks the L3-4, L4-5, lumbar facet joints. Laterality: Bilateral   Sedation: Please see nurses note for DOS. When no sedatives are used, the analgesic levels obtained are directly associated to the effectiveness of the local anesthetics. However, when sedation is provided, the level of analgesia obtained during the initial 1 hour following the intervention, is believed to be the result of a combination of factors. These factors may include, but are not limited to: 1. The effectiveness of the local anesthetics used. 2. The effects of the analgesic(s) and/or anxiolytic(s) used. 3. The degree of discomfort experienced by the patient at the time of the procedure. 4. The patients ability and reliability in recalling and recording the events. 5. The presence and influence of possible secondary gains and/or psychosocial factors. Reported result: Relief experienced during the 1st hour after the procedure: 100 % (Ultra-Short Term Relief)            Interpretative annotation:  Clinically appropriate result. Analgesia during this period is likely  to be Local Anesthetic and/or IV Sedative (Analgesic/Anxiolytic) related.          Effects of local anesthetic: The analgesic effects attained during this period are directly associated to the localized infiltration of local anesthetics and therefore cary significant diagnostic value as to the etiological location, or anatomical origin, of the pain. Expected duration of relief is directly dependent on the pharmacodynamics of the local anesthetic used. Long-acting (4-6 hours) anesthetics used.  Reported result: Relief during the next 4 to 6 hour after the procedure: 100 % (Short-Term Relief)            Interpretative annotation: Clinically appropriate result. Analgesia during this period is likely to be Local Anesthetic-related.          Long-term benefit: Defined as the period of time past the expected duration of local anesthetics (1 hour for short-acting and 4-6 hours for long-acting). With the possible exception of prolonged sympathetic blockade from the local anesthetics, benefits during this period are typically attributed to, or associated with, other factors such as analgesic sensory neuropraxia, antiinflammatory effects, or beneficial biochemical changes provided by agents other than the local anesthetics.  Reported result: Extended relief following procedure: 75% pain relief for 1 week with gradual return of pain, pain now at baseline, pre-block level(Long-Term Relief)            Interpretative annotation: Clinically appropriate result. Good relief. No permanent benefit expected. Inflammation plays a part in the etiology to the pain.          UDS:  Summary  Date Value Ref Range Status  02/11/2018 FINAL  Final    Comment:    ==================================================================== TOXASSURE COMP DRUG ANALYSIS,UR ==================================================================== Test                             Result       Flag       Units Drug Present and Declared for  Prescription Verification   Acetaminophen                  PRESENT      EXPECTED   Ibuprofen                      PRESENT      EXPECTED Drug Present not Declared for Prescription Verification   Diphenhydramine                PRESENT      UNEXPECTED Drug Absent but Declared for Prescription Verification   Diclofenac                     Not Detected UNEXPECTED    Diclofenac, as indicated in the declared medication list, is not    always detected even when used as directed. ==================================================================== Test                      Result    Flag   Units      Ref Range   Creatinine              142              mg/dL      >=20 ==================================================================== Declared Medications:  The flagging and interpretation on this report are based on the  following declared medications.  Unexpected results may arise from  inaccuracies  in the declared medications.  **Note: The testing scope of this panel does not include small to  moderate amounts of these reported medications:  Acetaminophen  Diclofenac  Ibuprofen  **Note: The testing scope of this panel does not include following  reported medications:  Calcium carbonate (Calcium carbonate/Vitamin D)  Multivitamin  Vitamin B (Biotin)  Vitamin D (Calcium carbonate/Vitamin D) ==================================================================== For clinical consultation, please call [phone]. ====================================================================    Laboratory Chemistry Profile (12 mo)  Renal: No results found for requested labs within last 8760 hours.  Lab Results  Component Value Date   GFRAA 106 04/09/2017   GFRNONAA 91 04/09/2017   Hepatic: No results found for requested labs within last 8760 hours. Lab Results  Component Value Date   AST 28 04/09/2017   ALT 14 04/09/2017   Other: No results found for requested labs within last 8760  hours. Note: Above Lab results reviewed.   Assessment  The primary encounter diagnosis was Lumbar facet arthropathy. Diagnoses of Lumbar spondylosis and Lumbar degenerative disc disease (modeate L4/L5) were also pertinent to this visit.  Plan of Care  I am having Kaitlyn Harvey maintain her multivitamin, Biotin, acetaminophen, ibuprofen, calcium-vitamin D, and diclofenac.  Status post 2 diagnostic lumbar facet medial branch nerve blocks on 03/12/2018 and 10/01/2018 at bilateral L3, L4, L5.  Both these were positive diagnostic nerve blocks that resulted in approximately 75% pain relief for at least 7 days.  Discussed radiofrequency ablation for the purpose of obtaining long-term benefit.  Risk and benefits reviewed and patient like to proceed.  We will start with right side first followed then by left. Orders:  Orders Placed This Encounter  Procedures  . RFA - Lumbar Facet (Schedule)    Standing Status:   Future    Standing Expiration Date:   05/04/2020    Scheduling Instructions:     Side(s): RIGHT     Level: L3-4, L4-5  Facets ( L3, L4, L5,  Medial Branch Nerves)     Sedation: With Sedation     Scheduling Timeframe: As soon as pre-approved    Order Specific Question:   Where will this procedure be performed?    Answer:   [facility]   Follow-up plan:   Return in about 2 weeks (around 11/19/2018) for Procedure RIGHT L3, 4, 5 RFA, with sedation.    Recent Visits Date Type Provider Dept  10/01/18 Procedure visit Gillis Santa, Harvey [facility]  Showing recent visits within past 90 days and meeting all other requirements   Today's Visits Date Type Provider Dept  11/05/18 Office Visit Gillis Santa, Harvey [facility]  Showing today's visits and meeting all other requirements   Future Appointments No visits were found meeting these conditions.  Showing future appointments within next 90 days and meeting all other requirements   I discussed the assessment and  treatment plan with the patient. The patient was provided an opportunity to ask questions and all were answered. The patient agreed with the plan and demonstrated an understanding of the instructions.  Patient advised to call back or seek an in-person evaluation if the symptoms or condition worsens.  Total duration of non-face-to-face encounter: 25 minutes.  Note by: Gillis Santa, Harvey Date: 11/05/2018; Time: 3:00 PM  Note: This dictation was prepared with Dragon dictation. Any transcriptional errors that may result from this process are unintentional.  Disclaimer:  * Given the special circumstances of the COVID-19 pandemic, the federal government has announced that the Office for  Civil Rights (OCR) will exercise its enforcement discretion and will not impose penalties on physicians using telehealth in the event of noncompliance with regulatory requirements under the [place] (HIPAA) in connection with the good faith provision of telehealth during the ZVJKQ-20 national public health emergency. ([place])

## 2018-12-01 ENCOUNTER — Ambulatory Visit: Payer: BC Managed Care – PPO | Admitting: Student in an Organized Health Care Education/Training Program

## 2018-12-03 ENCOUNTER — Encounter: Payer: Self-pay | Admitting: Student in an Organized Health Care Education/Training Program

## 2018-12-03 ENCOUNTER — Other Ambulatory Visit: Payer: Self-pay

## 2018-12-03 ENCOUNTER — Ambulatory Visit
Admission: RE | Admit: 2018-12-03 | Discharge: 2018-12-03 | Disposition: A | Discharge status: Home / Self Care | Payer: BC Managed Care – PPO | Source: Ambulatory Visit | Attending: Student in an Organized Health Care Education/Training Program | Admitting: Student in an Organized Health Care Education/Training Program

## 2018-12-03 ENCOUNTER — Ambulatory Visit (HOSPITAL_BASED_OUTPATIENT_CLINIC_OR_DEPARTMENT_OTHER): Payer: BC Managed Care – PPO | Admitting: Student in an Organized Health Care Education/Training Program

## 2018-12-03 DIAGNOSIS — M47816 Spondylosis without myelopathy or radiculopathy, lumbar region: Secondary | ICD-10-CM | POA: Insufficient documentation

## 2018-12-03 MED ORDER — LIDOCAINE HCL 2 % IJ SOLN
INTRAMUSCULAR | Status: AC
  Filled 2018-12-03: qty 20

## 2018-12-03 MED ORDER — MIDAZOLAM HCL 5 MG/5ML IJ SOLN
1.0000 mg | INTRAMUSCULAR | Status: DC | PRN
  Administered 2018-12-03: 1 mg via INTRAVENOUS

## 2018-12-03 MED ORDER — ROPIVACAINE HCL 2 MG/ML IJ SOLN
INTRAMUSCULAR | Status: AC
  Filled 2018-12-03: qty 10

## 2018-12-03 MED ORDER — DEXAMETHASONE SODIUM PHOSPHATE 10 MG/ML IJ SOLN
INTRAMUSCULAR | Status: AC
  Filled 2018-12-03: qty 1

## 2018-12-03 MED ORDER — MIDAZOLAM HCL 5 MG/5ML IJ SOLN
INTRAMUSCULAR | Status: AC
  Filled 2018-12-03: qty 5

## 2018-12-03 MED ORDER — LIDOCAINE HCL 2 % IJ SOLN
20.0000 mL | Freq: Once | INTRAMUSCULAR | Status: AC
  Administered 2018-12-03: 400 mg

## 2018-12-03 MED ORDER — ROPIVACAINE HCL 2 MG/ML IJ SOLN
9.0000 mL | Freq: Once | INTRAMUSCULAR | Status: AC
  Administered 2018-12-03: 9 mL via PERINEURAL

## 2018-12-03 MED ORDER — FENTANYL CITRATE (PF) 100 MCG/2ML IJ SOLN
25.0000 ug | INTRAMUSCULAR | Status: DC | PRN
  Administered 2018-12-03: 25 ug via INTRAVENOUS

## 2018-12-03 MED ORDER — DEXAMETHASONE SODIUM PHOSPHATE 10 MG/ML IJ SOLN
10.0000 mg | Freq: Once | INTRAMUSCULAR | Status: AC
  Administered 2018-12-03: 10 mg

## 2018-12-03 MED ORDER — FENTANYL CITRATE (PF) 100 MCG/2ML IJ SOLN
INTRAMUSCULAR | Status: AC
  Filled 2018-12-03: qty 2

## 2018-12-03 NOTE — Patient Instructions (Signed)

## 2018-12-03 NOTE — Progress Notes (Signed)
Patient's Name: Kaitlyn Harvey  MRN: [id number]  Referring Provider: Gillis Santa, MD  DOB: [birth year]  PCP: Arnetha Courser, MD  DOS: 12/03/2018  Note by: Gillis Santa, MD  Service setting: Ambulatory outpatient  Specialty: Interventional Pain Management  Patient type: Established  Location: [facility] (AMB) Pain Management Facility  Visit type: Interventional Procedure   Primary Reason for Visit: Interventional Pain Management Treatment. CC: Back Pain (lower back)  Procedure:          Anesthesia, Analgesia, Anxiolysis:  Type: Thermal Lumbar Facet, Medial Branch Radiofrequency Ablation/Neurotomy  #1  Primary Purpose: Therapeutic Region: Posterolateral Lumbosacral Spine Level:  L3, L4, L5,Medial Branch Level(s). These levels will denervate the L3-4, L4-5, and the L5-S1 lumbar facet joints. Laterality: Right  Type: Moderate (Conscious) Sedation combined with Local Anesthesia Indication(s): Analgesia and Anxiety Route: Intravenous (IV) IV Access: Secured Sedation: Meaningful verbal contact was maintained at all times during the procedure  Local Anesthetic: Lidocaine 1-2%  Position: Prone   Indications: 1. Lumbar facet arthropathy    Kaitlyn Harvey has been dealing with the above chronic pain for longer than three months and has either failed to respond, was unable to tolerate, or simply did not get enough benefit from other more conservative therapies including, but not limited to: 1. Over-the-counter medications 2. Anti-inflammatory medications 3. Muscle relaxants 4. Membrane stabilizers 5. Opioids 6. Physical therapy and/or chiropractic manipulation 7. Modalities (Heat, ice, etc.) 8. Invasive techniques such as nerve blocks. Kaitlyn Harvey has attained more than 50% relief of the pain from a series of diagnostic injections conducted in separate occasions.  Pain Score: Pre-procedure: 5 /10 Post-procedure: 0-No pain/10  Pre-op Assessment:  Kaitlyn Harvey is a 43 y.o. (year old), female patient, seen today  for interventional treatment. She  has a past surgical history that includes Upper gi endoscopy and Upper gastrointestinal endoscopy (2008). Kaitlyn Harvey has a current medication list which includes the following prescription(s): acetaminophen, biotin, calcium-vitamin d, diclofenac, ibuprofen, and multivitamin, and the following Facility-Administered Medications: fentanyl and midazolam. Her primarily concern today is the Back Pain (lower back)  Initial Vital Signs:  Pulse/HCG Rate: 63ECG Heart Rate: 72 Temp: (!) 97.5 F (36.4 C) Resp: 18 BP: 111/74 SpO2: 100 %  BMI: Estimated body mass index is 22.47 kg/m as calculated from the following:   Height as of this encounter: 5' 5"  (1.651 m).   Weight as of this encounter: 135 lb (61.2 kg).  Risk Assessment: Allergies: Reviewed. She has No Known Allergies.  Allergy Precautions: None required Coagulopathies: Reviewed. None identified.  Blood-thinner therapy: None at this time Active Infection(s): Reviewed. None identified. Kaitlyn Harvey is afebrile  Site Confirmation: Kaitlyn Harvey was asked to confirm the procedure and laterality before marking the site Procedure checklist: Completed Consent: Before the procedure and under the influence of no sedative(s), amnesic(s), or anxiolytics, the patient was informed of the treatment options, risks and possible complications. To fulfill our ethical and legal obligations, as recommended by the American Medical Association's Code of Ethics, I have informed the patient of my clinical impression; the nature and purpose of the treatment or procedure; the risks, benefits, and possible complications of the intervention; the alternatives, including doing nothing; the risk(s) and benefit(s) of the alternative treatment(s) or procedure(s); and the risk(s) and benefit(s) of doing nothing. The patient was provided information about the general risks and possible complications associated with the procedure. These may include, but are  not limited to: failure to achieve desired goals, infection, bleeding, organ or nerve damage, allergic  reactions, paralysis, and death. In addition, the patient was informed of those risks and complications associated to Spine-related procedures, such as failure to decrease pain; infection (i.e.: Meningitis, epidural or intraspinal abscess); bleeding (i.e.: epidural hematoma, subarachnoid hemorrhage, or any other type of intraspinal or peri-dural bleeding); organ or nerve damage (i.e.: Any type of peripheral nerve, nerve root, or spinal cord injury) with subsequent damage to sensory, motor, and/or autonomic systems, resulting in permanent pain, numbness, and/or weakness of one or several areas of the body; allergic reactions; (i.e.: anaphylactic reaction); and/or death. Furthermore, the patient was informed of those risks and complications associated with the medications. These include, but are not limited to: allergic reactions (i.e.: anaphylactic or anaphylactoid reaction(s)); adrenal axis suppression; blood sugar elevation that in diabetics may result in ketoacidosis or comma; water retention that in patients with history of congestive heart failure may result in shortness of breath, pulmonary edema, and decompensation with resultant heart failure; weight gain; swelling or edema; medication-induced neural toxicity; particulate matter embolism and blood vessel occlusion with resultant organ, and/or nervous system infarction; and/or aseptic necrosis of one or more joints. Finally, the patient was informed that Medicine is not an exact science; therefore, there is also the possibility of unforeseen or unpredictable risks and/or possible complications that may result in a catastrophic outcome. The patient indicated having understood very clearly. We have given the patient no guarantees and we have made no promises. Enough time was given to the patient to ask questions, all of which were answered to the patient's  satisfaction. Kaitlyn Harvey has indicated that she wanted to continue with the procedure. Attestation: I, the ordering provider, attest that I have discussed with the patient the benefits, risks, side-effects, alternatives, likelihood of achieving goals, and potential problems during recovery for the procedure that I have provided informed consent. Date   Time: 12/03/2018  9:45 AM  Pre-Procedure Preparation:  Monitoring: As per clinic protocol. Respiration, ETCO2, SpO2, BP, heart rate and rhythm monitor placed and checked for adequate function Safety Precautions: Patient was assessed for positional comfort and pressure points before starting the procedure. Time-out: I initiated and conducted the "Time-out" before starting the procedure, as per protocol. The patient was asked to participate by confirming the accuracy of the "Time Out" information. Verification of the correct person, site, and procedure were performed and confirmed by me, the nursing staff, and the patient. "Time-out" conducted as per Joint Commission's Universal Protocol (UP.01.01.01). Time: 1026  Description of Procedure:          Laterality: Right Levels: L3, L4, L5,  Medial Branch Level(s), at the L3-4, L4-5, lumbar facet joints. Area Prepped: Lumbosacral Prepping solution: DuraPrep (Iodine Povacrylex [0.7% available iodine] and Isopropyl Alcohol, 74% w/w) Safety Precautions: Aspiration looking for blood return was conducted prior to all injections. At no point did we inject any substances, as a needle was being advanced. Before injecting, the patient was told to immediately notify me if she was experiencing any new onset of "ringing in the ears, or metallic taste in the mouth". No attempts were made at seeking any paresthesias. Safe injection practices and needle disposal techniques used. Medications properly checked for expiration dates. SDV (single dose vial) medications used. After the completion of the procedure, all disposable  equipment used was discarded in the proper designated medical waste containers. Local Anesthesia: Protocol guidelines were followed. The patient was positioned over the fluoroscopy table. The area was prepped in the usual manner. The time-out was completed. The target area was identified  using fluoroscopy. A 12-in long, straight, sterile hemostat was used with fluoroscopic guidance to locate the targets for each level blocked. Once located, the skin was marked with an approved surgical skin marker. Once all sites were marked, the skin (epidermis, dermis, and hypodermis), as well as deeper tissues (fat, connective tissue and muscle) were infiltrated with a small amount of a short-acting local anesthetic, loaded on a 10cc syringe with a 25G, 1.5-in  Needle. An appropriate amount of time was allowed for local anesthetics to take effect before proceeding to the next step. Local Anesthetic: Lidocaine 2.0% The unused portion of the local anesthetic was discarded in the proper designated containers. Technical explanation of process:   Radiofrequency Ablation (RFA) L3 Medial Branch Nerve RFA: The target area for the L3 medial branch is at the junction of the postero-lateral aspect of the superior articular process and the superior, posterior, and medial edge of the transverse process of L4. Under fluoroscopic guidance, a Radiofrequency needle was inserted until contact was made with os over the superior postero-lateral aspect of the pedicular shadow (target area). Sensory and motor testing was conducted to properly adjust the position of the needle. Once satisfactory placement of the needle was achieved, the numbing solution was slowly injected after negative aspiration for blood. 2.0 mL of the nerve block solution was injected without difficulty or complication. After waiting for at least 3 minutes, the ablation was performed. Once completed, the needle was removed intact. L4 Medial Branch Nerve RFA: The target area  for the L4 medial branch is at the junction of the postero-lateral aspect of the superior articular process and the superior, posterior, and medial edge of the transverse process of L5. Under fluoroscopic guidance, a Radiofrequency needle was inserted until contact was made with os over the superior postero-lateral aspect of the pedicular shadow (target area). Sensory and motor testing was conducted to properly adjust the position of the needle. Once satisfactory placement of the needle was achieved, the numbing solution was slowly injected after negative aspiration for blood. 2.0 mL of the nerve block solution was injected without difficulty or complication. After waiting for at least 3 minutes, the ablation was performed. Once completed, the needle was removed intact. L5 Medial Branch Nerve RFA: The target area for the L5 medial branch is at the junction of the postero-lateral aspect of the superior articular process of S1 and the superior, posterior, and medial edge of the sacral ala. Under fluoroscopic guidance, a Radiofrequency needle was inserted until contact was made with os over the superior postero-lateral aspect of the pedicular shadow (target area). Sensory and motor testing was conducted to properly adjust the position of the needle. Once satisfactory placement of the needle was achieved, the numbing solution was slowly injected after negative aspiration for blood. 2.0 mL of the nerve block solution was injected without difficulty or complication. After waiting for at least 3 minutes, the ablation was performed. Once completed, the needle was removed intact.  Radiofrequency lesioning (ablation):  Radiofrequency Generator: NeuroTherm [id number] Sensory Stimulation Parameters: 50 Hz was used to locate & identify the nerve, making sure that the needle was positioned such that there was no sensory stimulation below 0.3 V or above 0.7 V. Motor Stimulation Parameters: 2 Hz was used to evaluate the motor  component. Care was taken not to lesion any nerves that demonstrated motor stimulation of the lower extremities at an output of less than 2.5 times that of the sensory threshold, or a maximum of 2.0 V. Lesioning Technique  Parameters: Standard Radiofrequency settings. (Not bipolar or pulsed.) Temperature Settings: 80 degrees C Lesioning time: 60 seconds Intra-operative Compliance: Compliant Materials & Medications: Needle(s) (Electrode/Cannula) Type: Teflon-coated, curved tip, Radiofrequency needle(s) Gauge: 22G Length: 10cm Numbing solution: 8 cc solution made of 7 cc of 0.2% ropivacaine, 1 cc of Decadron 10 mg/cc, 2 cc injected at each level above after sensorimotor testing, prior to lesioning  The unused portion of the solution was discarded in the proper designated containers.  Once the entire procedure was completed, the treated area was cleaned, making sure to leave some of the prepping solution back to take advantage of its long term bactericidal properties.  Illustration of the posterior view of the lumbar spine and the posterior neural structures. Laminae of L2 through S1 are labeled. DPRL5, dorsal primary ramus of L5; DPRS1, dorsal primary ramus of S1; DPR3, dorsal primary ramus of L3; FJ, facet (zygapophyseal) joint L3-L4; I, inferior articular process of L4; LB1, lateral branch of dorsal primary ramus of L1; IAB, inferior articular branches from L3 medial branch (supplies L4-L5 facet joint); IBP, intermediate branch plexus; MB3, medial branch of dorsal primary ramus of L3; NR3, third lumbar nerve root; S, superior articular process of L5; SAB, superior articular branches from L4 (supplies L4-5 facet joint also); TP3, transverse process of L3.  Vitals:   12/03/18 1045 12/03/18 1050 12/03/18 1100 12/03/18 1110  BP: 138/84 (!) 125/95 (!) 127/92 (!) 123/96  Pulse: 70     Resp: 19 15 20 20   Temp:  97.7 F (36.5 C)    TempSrc:      SpO2: 100% 100% 100% 100%  Weight:      Height:          Start Time: 1026 hrs. End Time: 1044 hrs.  Imaging Guidance (Spinal):          Type of Imaging Technique: Fluoroscopy Guidance (Spinal) Indication(s): Assistance in needle guidance and placement for procedures requiring needle placement in or near specific anatomical locations not easily accessible without such assistance. Exposure Time: Please see nurses notes. Contrast: None used. Fluoroscopic Guidance: I was personally present during the use of fluoroscopy. "Tunnel Vision Technique" used to obtain the best possible view of the target area. Parallax error corrected before commencing the procedure. "Direction-depth-direction" technique used to introduce the needle under continuous pulsed fluoroscopy. Once target was reached, antero-posterior, oblique, and lateral fluoroscopic projection used confirm needle placement in all planes. Images permanently stored in EMR. Interpretation: No contrast injected. I personally interpreted the imaging intraoperatively. Adequate needle placement confirmed in multiple planes. Permanent images saved into the patient's record.  Antibiotic Prophylaxis:   Anti-infectives (From admission, onward)   None     Indication(s): None identified  Post-operative Assessment:  Post-procedure Vital Signs:  Pulse/HCG Rate: 7069 Temp: 97.7 F (36.5 C) Resp: 20 BP: (!) 123/96 SpO2: 100 %  EBL: None  Complications: No immediate post-treatment complications observed by team, or reported by patient.  Note: The patient tolerated the entire procedure well. A repeat set of vitals were taken after the procedure and the patient was kept under observation following institutional policy, for this type of procedure. Post-procedural neurological assessment was performed, showing return to baseline, prior to discharge. The patient was provided with post-procedure discharge instructions, including a section on how to identify potential problems. Should any problems arise  concerning this procedure, the patient was given instructions to immediately contact us, at any time, without hesitation. In any case, we plan to contact the patient by telephone for  a follow-up status report regarding this interventional procedure.  Comments:  No additional relevant information.  Plan of Care  Orders:  Orders Placed This Encounter  Procedures   RFA - Lumbar Facet (Schedule)    Standing Status:   Future    Standing Expiration Date:   06/02/2020    Scheduling Instructions:     Side(s): Left-sided     Level: L3-4, L4-5, (L3, L4, L5, Medial Branch Nerves)     Sedation: With Sedation     Scheduling Timeframe: As soon as pre-approved    Order Specific Question:   Where will this procedure be performed?    Answer:   [facility]   Fluoro (C-Arm) (<60 min) (No Report)    Intraoperative interpretation by procedural physician at [facility].    Standing Status:   Standing    Number of Occurrences:   1    Order Specific Question:   Reason for exam:    Answer:   Assistance in needle guidance and placement for procedures requiring needle placement in or near specific anatomical locations not easily accessible without such assistance.   Medications ordered for procedure: Meds ordered this encounter  Medications   lidocaine (XYLOCAINE) 2 % (with pres) injection 400 mg   midazolam (VERSED) 5 MG/5ML injection 1-2 mg    Make sure Flumazenil is available in the pyxis when using this medication. If oversedation occurs, administer 0.2 mg IV over 15 sec. If after 45 sec no response, administer 0.2 mg again over 1 min; may repeat at 1 min intervals; not to exceed 4 doses (1 mg)   fentaNYL (SUBLIMAZE) injection 25-50 mcg    Make sure Narcan is available in the pyxis when using this medication. In the event of respiratory depression (RR< 8/min): Titrate NARCAN (naloxone) in increments of 0.1 to 0.2 mg IV at 2-3 minute intervals, until desired degree of reversal.    ropivacaine (PF) 2 mg/mL (0.2%) (NAROPIN) injection 9 mL   dexamethasone (DECADRON) injection 10 mg   Medications administered: We administered lidocaine, midazolam, fentaNYL, ropivacaine (PF) 2 mg/mL (0.2%), and dexamethasone.  See the medical record for exact dosing, route, and time of administration.  Follow-up plan:   Return in about 2 weeks (around 12/17/2018) for Contra-lateral RFA L L3, 4, 5, with sedation.      Status post right L3, L4, L5 RFA on 12/03/2018   Recent Visits Date Type Provider Dept  11/05/18 Office Visit Gillis Santa, MD [facility]  10/01/18 Procedure visit Gillis Santa, MD [facility]  Showing recent visits within past 90 days and meeting all other requirements   Today's Visits Date Type Provider Dept  12/03/18 Procedure visit Gillis Santa, MD [facility]  Showing today's visits and meeting all other requirements   Future Appointments No visits were found meeting these conditions.  Showing future appointments within next 90 days and meeting all other requirements   Disposition: Discharge home  Discharge Date & Time: 12/03/2018; 1110 hrs.   Primary Care Physician: Arnetha Courser, MD Location: [facility] Note by: Gillis Santa, MD Date: 12/03/2018; Time: 11:34 AM  Disclaimer:  Medicine is not an exact science. The only guarantee in medicine is that nothing is guaranteed. It is important to note that the decision to proceed with this intervention was based on the information collected from the patient. The Data and conclusions were drawn from the patient's questionnaire, the interview, and the physical examination. Because the information was  provided in large part by the patient, it cannot be guaranteed that it has not been purposely or unconsciously manipulated. Every effort has been made to obtain as much relevant data as possible for this evaluation. It is important to note that the conclusions  that lead to this procedure are derived in large part from the available data. Always take into account that the treatment will also be dependent on availability of resources and existing treatment guidelines, considered by other Pain Management Practitioners as being common knowledge and practice, at the time of the intervention. For Medico-Legal purposes, it is also important to point out that variation in procedural techniques and pharmacological choices are the acceptable norm. The indications, contraindications, technique, and results of the above procedure should only be interpreted and judged by a Board-Certified Interventional Pain Specialist with extensive familiarity and expertise in the same exact procedure and technique.

## 2019-01-14 ENCOUNTER — Ambulatory Visit
Admission: RE | Admit: 2019-01-14 | Discharge: 2019-01-14 | Disposition: A | Discharge status: Home / Self Care | Payer: BC Managed Care – PPO | Source: Ambulatory Visit | Attending: Student in an Organized Health Care Education/Training Program | Admitting: Student in an Organized Health Care Education/Training Program

## 2019-01-14 ENCOUNTER — Other Ambulatory Visit: Payer: Self-pay

## 2019-01-14 ENCOUNTER — Ambulatory Visit (HOSPITAL_BASED_OUTPATIENT_CLINIC_OR_DEPARTMENT_OTHER): Payer: BC Managed Care – PPO | Admitting: Student in an Organized Health Care Education/Training Program

## 2019-01-14 ENCOUNTER — Encounter: Payer: Self-pay | Admitting: Student in an Organized Health Care Education/Training Program

## 2019-01-14 DIAGNOSIS — M47816 Spondylosis without myelopathy or radiculopathy, lumbar region: Secondary | ICD-10-CM

## 2019-01-14 MED ORDER — LIDOCAINE HCL 2 % IJ SOLN
20.0000 mL | Freq: Once | INTRAMUSCULAR | Status: AC
  Administered 2019-01-14: 09:00:00 400 mg

## 2019-01-14 MED ORDER — DEXAMETHASONE SODIUM PHOSPHATE 10 MG/ML IJ SOLN
INTRAMUSCULAR | Status: AC
  Filled 2019-01-14: qty 1

## 2019-01-14 MED ORDER — FENTANYL CITRATE (PF) 100 MCG/2ML IJ SOLN
INTRAMUSCULAR | Status: AC
  Filled 2019-01-14: qty 2

## 2019-01-14 MED ORDER — ROPIVACAINE HCL 2 MG/ML IJ SOLN
9.0000 mL | Freq: Once | INTRAMUSCULAR | Status: AC
  Administered 2019-01-14: 9 mL via PERINEURAL

## 2019-01-14 MED ORDER — DEXAMETHASONE SODIUM PHOSPHATE 10 MG/ML IJ SOLN
10.0000 mg | Freq: Once | INTRAMUSCULAR | Status: AC
  Administered 2019-01-14: 10 mg

## 2019-01-14 MED ORDER — LIDOCAINE HCL 2 % IJ SOLN
INTRAMUSCULAR | Status: AC
  Filled 2019-01-14: qty 20

## 2019-01-14 MED ORDER — ROPIVACAINE HCL 2 MG/ML IJ SOLN
INTRAMUSCULAR | Status: AC
  Filled 2019-01-14: qty 10

## 2019-01-14 NOTE — Patient Instructions (Signed)

## 2019-01-14 NOTE — Progress Notes (Signed)
Safety precautions to be maintained throughout the outpatient stay will include: orient to surroundings, keep bed in low position, maintain call bell within reach at all times, provide assistance with transfer out of bed and ambulation.  

## 2019-01-14 NOTE — Progress Notes (Signed)
Patient's Name: Kaitlyn Harvey  MRN: [id number]  Referring Provider: Gillis Santa, MD  DOB: [birth year]  PCP: Arnetha Courser, MD  DOS: 01/14/2019  Note by: Gillis Santa, MD  Service setting: Ambulatory outpatient  Specialty: Interventional Pain Management  Patient type: Established  Location: [facility] (AMB) Pain Management Facility  Visit type: Interventional Procedure   Primary Reason for Visit: Interventional Pain Management Treatment. CC: Back Pain (left lower back )  Procedure:          Anesthesia, Analgesia, Anxiolysis:  Type: Thermal Lumbar Facet, Medial Branch Radiofrequency Ablation/Neurotomy  #1  Primary Purpose: Therapeutic Region: Posterolateral Lumbosacral Spine Level:  L3, L4, L5,Medial Branch Level(s). These levels will denervate the L3-4, L4-5, and the L5-S1 lumbar facet joints. Laterality: Left  Type: Local Anesthesia  Local Anesthetic: Lidocaine 1-2%  Position: Prone   Indications: 1. Lumbar facet arthropathy    Kaitlyn Harvey has been dealing with the above chronic pain for longer than three months and has either failed to respond, was unable to tolerate, or simply did not get enough benefit from other more conservative therapies including, but not limited to: 1. Over-the-counter medications 2. Anti-inflammatory medications 3. Muscle relaxants 4. Membrane stabilizers 5. Opioids 6. Physical therapy and/or chiropractic manipulation 7. Modalities (Heat, ice, etc.) 8. Invasive techniques such as nerve blocks. Kaitlyn Harvey has attained more than 50% relief of the pain from a series of diagnostic injections conducted in separate occasions.  Pain Score: Pre-procedure: 1 /10 Post-procedure: 0-No pain/10  Pre-op Assessment:  Kaitlyn Harvey is a 44 y.o. (year old), female patient, seen today for interventional treatment. She  has a past surgical history that includes Upper gi endoscopy and Upper gastrointestinal endoscopy (2008). [name] has a current medication list which includes the  following prescription(s): acetaminophen, biotin, calcium-vitamin d, ibuprofen, multivitamin, and diclofenac. Her primarily concern today is the Back Pain (left lower back )  Initial Vital Signs:  Pulse/HCG Rate: 64ECG Heart Rate: 72 Temp: (!) 97.3 F (36.3 C) Resp: 17 BP: 121/83 SpO2: 100 %  BMI: Estimated body mass index is 22.47 kg/m as calculated from the following:   Height as of this encounter: 5' 5"  (1.651 m).   Weight as of this encounter: 135 lb (61.2 kg).  Risk Assessment: Allergies: Reviewed. She has No Known Allergies.  Allergy Precautions: None required Coagulopathies: Reviewed. None identified.  Blood-thinner therapy: None at this time Active Infection(s): Reviewed. None identified. Kaitlyn Harvey is afebrile  Site Confirmation: Kaitlyn Harvey was asked to confirm the procedure and laterality before marking the site Procedure checklist: Completed Consent: Before the procedure and under the influence of no sedative(s), amnesic(s), or anxiolytics, the patient was informed of the treatment options, risks and possible complications. To fulfill our ethical and legal obligations, as recommended by the American Medical Association's Code of Ethics, I have informed the patient of my clinical impression; the nature and purpose of the treatment or procedure; the risks, benefits, and possible complications of the intervention; the alternatives, including doing nothing; the risk(s) and benefit(s) of the alternative treatment(s) or procedure(s); and the risk(s) and benefit(s) of doing nothing. The patient was provided information about the general risks and possible complications associated with the procedure. These may include, but are not limited to: failure to achieve desired goals, infection, bleeding, organ or nerve damage, allergic reactions, paralysis, and death. In addition, the patient was informed of those risks and complications associated to Spine-related procedures, such as failure to  decrease pain; infection (i.e.: Meningitis, epidural or intraspinal  abscess); bleeding (i.e.: epidural hematoma, subarachnoid hemorrhage, or any other type of intraspinal or peri-dural bleeding); organ or nerve damage (i.e.: Any type of peripheral nerve, nerve root, or spinal cord injury) with subsequent damage to sensory, motor, and/or autonomic systems, resulting in permanent pain, numbness, and/or weakness of one or several areas of the body; allergic reactions; (i.e.: anaphylactic reaction); and/or death. Furthermore, the patient was informed of those risks and complications associated with the medications. These include, but are not limited to: allergic reactions (i.e.: anaphylactic or anaphylactoid reaction(s)); adrenal axis suppression; blood sugar elevation that in diabetics may result in ketoacidosis or comma; water retention that in patients with history of congestive heart failure may result in shortness of breath, pulmonary edema, and decompensation with resultant heart failure; weight gain; swelling or edema; medication-induced neural toxicity; particulate matter embolism and blood vessel occlusion with resultant organ, and/or nervous system infarction; and/or aseptic necrosis of one or more joints. Finally, the patient was informed that Medicine is not an exact science; therefore, there is also the possibility of unforeseen or unpredictable risks and/or possible complications that may result in a catastrophic outcome. The patient indicated having understood very clearly. We have given the patient no guarantees and we have made no promises. Enough time was given to the patient to ask questions, all of which were answered to the patient's satisfaction. Kaitlyn Harvey has indicated that she wanted to continue with the procedure. Attestation: I, the ordering provider, attest that I have discussed with the patient the benefits, risks, side-effects, alternatives, likelihood of achieving goals, and potential  problems during recovery for the procedure that I have provided informed consent. Date  Time: 01/14/2019  7:58 AM  Pre-Procedure Preparation:  Monitoring: As per clinic protocol. Respiration, ETCO2, SpO2, BP, heart rate and rhythm monitor placed and checked for adequate function Safety Precautions: Patient was assessed for positional comfort and pressure points before starting the procedure. Time-out: I initiated and conducted the "Time-out" before starting the procedure, as per protocol. The patient was asked to participate by confirming the accuracy of the "Time Out" information. Verification of the correct person, site, and procedure were performed and confirmed by me, the nursing staff, and the patient. "Time-out" conducted as per Joint Commission's Universal Protocol (UP.01.01.01). Time: 9833  Description of Procedure:          Laterality: Left Levels: L3, L4, L5,  Medial Branch Level(s), at the L3-4, L4-5, lumbar facet joints. Area Prepped: Lumbosacral Prepping solution: DuraPrep (Iodine Povacrylex [0.7% available iodine] and Isopropyl Alcohol, 74% w/w) Safety Precautions: Aspiration looking for blood return was conducted prior to all injections. At no point did we inject any substances, as a needle was being advanced. Before injecting, the patient was told to immediately notify me if she was experiencing any new onset of "ringing in the ears, or metallic taste in the mouth". No attempts were made at seeking any paresthesias. Safe injection practices and needle disposal techniques used. Medications properly checked for expiration dates. SDV (single dose vial) medications used. After the completion of the procedure, all disposable equipment used was discarded in the proper designated medical waste containers. Local Anesthesia: Protocol guidelines were followed. The patient was positioned over the fluoroscopy table. The area was prepped in the usual manner. The time-out was completed. The target  area was identified using fluoroscopy. A 12-in long, straight, sterile hemostat was used with fluoroscopic guidance to locate the targets for each level blocked. Once located, the skin was marked with an approved surgical skin  marker. Once all sites were marked, the skin (epidermis, dermis, and hypodermis), as well as deeper tissues (fat, connective tissue and muscle) were infiltrated with a small amount of a short-acting local anesthetic, loaded on a 10cc syringe with a 25G, 1.5-in  Needle. An appropriate amount of time was allowed for local anesthetics to take effect before proceeding to the next step. Local Anesthetic: Lidocaine 2.0% The unused portion of the local anesthetic was discarded in the proper designated containers. Technical explanation of process:   Radiofrequency Ablation (RFA) L3 Medial Branch Nerve RFA: The target area for the L3 medial branch is at the junction of the postero-lateral aspect of the superior articular process and the superior, posterior, and medial edge of the transverse process of L4. Under fluoroscopic guidance, a Radiofrequency needle was inserted until contact was made with os over the superior postero-lateral aspect of the pedicular shadow (target area). Sensory and motor testing was conducted to properly adjust the position of the needle. Once satisfactory placement of the needle was achieved, the numbing solution was slowly injected after negative aspiration for blood. 2.0 mL of the nerve block solution was injected without difficulty or complication. After waiting for at least 3 minutes, the ablation was performed. Once completed, the needle was removed intact. L4 Medial Branch Nerve RFA: The target area for the L4 medial branch is at the junction of the postero-lateral aspect of the superior articular process and the superior, posterior, and medial edge of the transverse process of L5. Under fluoroscopic guidance, a Radiofrequency needle was inserted until contact was  made with os over the superior postero-lateral aspect of the pedicular shadow (target area). Sensory and motor testing was conducted to properly adjust the position of the needle. Once satisfactory placement of the needle was achieved, the numbing solution was slowly injected after negative aspiration for blood. 2.0 mL of the nerve block solution was injected without difficulty or complication. After waiting for at least 3 minutes, the ablation was performed. Once completed, the needle was removed intact. L5 Medial Branch Nerve RFA: The target area for the L5 medial branch is at the junction of the postero-lateral aspect of the superior articular process of S1 and the superior, posterior, and medial edge of the sacral ala. Under fluoroscopic guidance, a Radiofrequency needle was inserted until contact was made with os over the superior postero-lateral aspect of the pedicular shadow (target area). Sensory and motor testing was conducted to properly adjust the position of the needle. Once satisfactory placement of the needle was achieved, the numbing solution was slowly injected after negative aspiration for blood. 2.0 mL of the nerve block solution was injected without difficulty or complication. After waiting for at least 3 minutes, the ablation was performed. Once completed, the needle was removed intact.  Radiofrequency lesioning (ablation):  Radiofrequency Generator: NeuroTherm [id number] Sensory Stimulation Parameters: 50 Hz was used to locate & identify the nerve, making sure that the needle was positioned such that there was no sensory stimulation below 0.3 V or above 0.7 V. Motor Stimulation Parameters: 2 Hz was used to evaluate the motor component. Care was taken not to lesion any nerves that demonstrated motor stimulation of the lower extremities at an output of less than 2.5 times that of the sensory threshold, or a maximum of 2.0 V. Lesioning Technique Parameters: Standard Radiofrequency settings. (Not  bipolar or pulsed.) Temperature Settings: 80 degrees C Lesioning time: 60 seconds Intra-operative Compliance: Compliant Materials & Medications: Needle(s) (Electrode/Cannula) Type: Teflon-coated, curved tip, Radiofrequency needle(s) Gauge:  22G Length: 10cm Numbing solution: 8 cc solution made of 7 cc of 0.2% ropivacaine, 1 cc of Decadron 10 mg/cc, 2 cc injected at each level above after sensorimotor testing, prior to lesioning  The unused portion of the solution was discarded in the proper designated containers.  Once the entire procedure was completed, the treated area was cleaned, making sure to leave some of the prepping solution back to take advantage of its long term bactericidal properties.  Illustration of the posterior view of the lumbar spine and the posterior neural structures. Laminae of L2 through S1 are labeled. DPRL5, dorsal primary ramus of L5; DPRS1, dorsal primary ramus of S1; DPR3, dorsal primary ramus of L3; FJ, facet (zygapophyseal) joint L3-L4; I, inferior articular process of L4; LB1, lateral branch of dorsal primary ramus of L1; IAB, inferior articular branches from L3 medial branch (supplies L4-L5 facet joint); IBP, intermediate branch plexus; MB3, medial branch of dorsal primary ramus of L3; NR3, third lumbar nerve root; S, superior articular process of L5; SAB, superior articular branches from L4 (supplies L4-5 facet joint also); TP3, transverse process of L3.  Vitals:   01/14/19 0841 01/14/19 0846 01/14/19 0852 01/14/19 0858  BP: (!) 127/91 126/85 121/84 115/83  Pulse:      Resp: 12 20 20 19   Temp:      TempSrc:      SpO2: 100% 100% 100% 100%  Weight:      Height:        Start Time: 0836 hrs. End Time: 0858 hrs.  Imaging Guidance (Spinal):          Type of Imaging Technique: Fluoroscopy Guidance (Spinal) Indication(s): Assistance in needle guidance and placement for procedures requiring needle placement in or near specific anatomical locations not easily  accessible without such assistance. Exposure Time: Please see nurses notes. Contrast: None used. Fluoroscopic Guidance: I was personally present during the use of fluoroscopy. "Tunnel Vision Technique" used to obtain the best possible view of the target area. Parallax error corrected before commencing the procedure. "Direction-depth-direction" technique used to introduce the needle under continuous pulsed fluoroscopy. Once target was reached, antero-posterior, oblique, and lateral fluoroscopic projection used confirm needle placement in all planes. Images permanently stored in EMR. Interpretation: No contrast injected. I personally interpreted the imaging intraoperatively. Adequate needle placement confirmed in multiple planes. Permanent images saved into the patient's record.  Antibiotic Prophylaxis:   Anti-infectives (From admission, onward)   None     Indication(s): None identified  Post-operative Assessment:  Post-procedure Vital Signs:  Pulse/HCG Rate: 6468 Temp: (!) 97.3 F (36.3 C) Resp: 19 BP: 115/83 SpO2: 100 %  EBL: None  Complications: No immediate post-treatment complications observed by team, or reported by patient.  Note: The patient tolerated the entire procedure well. A repeat set of vitals were taken after the procedure and the patient was kept under observation following institutional policy, for this type of procedure. Post-procedural neurological assessment was performed, showing return to baseline, prior to discharge. The patient was provided with post-procedure discharge instructions, including a section on how to identify potential problems. Should any problems arise concerning this procedure, the patient was given instructions to immediately contact us, at any time, without hesitation. In any case, we plan to contact the patient by telephone for a follow-up status report regarding this interventional procedure.  Comments:  No additional relevant  information.  Plan of Care  Orders:  Orders Placed This Encounter  Procedures  . Fluoro (C-Arm) (<60 min) (No Report)  Intraoperative interpretation by procedural physician at [facility].    Standing Status:   Standing    Number of Occurrences:   1    Order Specific Question:   Reason for exam:    Answer:   Assistance in needle guidance and placement for procedures requiring needle placement in or near specific anatomical locations not easily accessible without such assistance.   Medications ordered for procedure: Meds ordered this encounter  Medications  . lidocaine (XYLOCAINE) 2 % (with pres) injection 400 mg  . ropivacaine (PF) 2 mg/mL (0.2%) (NAROPIN) injection 9 mL  . dexamethasone (DECADRON) injection 10 mg   Medications administered: We administered lidocaine, ropivacaine (PF) 2 mg/mL (0.2%), and dexamethasone.  See the medical record for exact dosing, route, and time of administration.  Follow-up plan:   Return in about 8 weeks (around 03/11/2019) for Post Procedure Evaluation, virtual.      Status post right L3, L4, L5 RFA on 12/03/2018, left L3, 4,5 RFA on 01/14/2019   Recent Visits Date Type Provider Dept  12/03/18 Procedure visit Gillis Santa, MD [facility]  11/05/18 Office Visit Gillis Santa, MD [facility]  Showing recent visits within past 90 days and meeting all other requirements   Today's Visits Date Type Provider Dept  01/14/19 Procedure visit Gillis Santa, MD [facility]  Showing today's visits and meeting all other requirements   Future Appointments Date Type Provider Dept  03/11/19 Appointment Gillis Santa, MD [facility]  Showing future appointments within next 90 days and meeting all other requirements   Disposition: Discharge home  Discharge Date & Time: 01/14/2019; 0905 hrs.   Primary Care Physician: Arnetha Courser, MD Location: [facility] Note by: Gillis Santa, MD Date: 01/14/2019; Time: 9:12 AM  Disclaimer:  Medicine is not an exact science. The only guarantee in medicine is that nothing is guaranteed. It is important to note that the decision to proceed with this intervention was based on the information collected from the patient. The Data and conclusions were drawn from the patient's questionnaire, the interview, and the physical examination. Because the information was provided in large part by the patient, it cannot be guaranteed that it has not been purposely or unconsciously manipulated. Every effort has been made to obtain as much relevant data as possible for this evaluation. It is important to note that the conclusions that lead to this procedure are derived in large part from the available data. Always take into account that the treatment will also be dependent on availability of resources and existing treatment guidelines, considered by other Pain Management Practitioners as being common knowledge and practice, at the time of the intervention. For Medico-Legal purposes, it is also important to point out that variation in procedural techniques and pharmacological choices are the acceptable norm. The indications, contraindications, technique, and results of the above procedure should only be interpreted and judged by a Board-Certified Interventional Pain Specialist with extensive familiarity and expertise in the same exact procedure and technique.

## 2019-01-15 ENCOUNTER — Telehealth: Payer: Self-pay | Admitting: *Deleted

## 2019-01-15 NOTE — Telephone Encounter (Signed)
No problems post procedure. 

## 2019-02-27 ENCOUNTER — Telehealth: Payer: Self-pay

## 2019-02-27 NOTE — Telephone Encounter (Signed)
Copied from [place] [id number]. Topic: Referral - Request for Referral >> Feb 27, 2019  1:46 PM Sheran Luz wrote: Has patient seen PCP for this complaint? Yes *If NO, is insurance requiring patient see PCP for this issue before PCP can refer them? Referral for which specialty: Vein and Vasc Preferred provider/office: [facility] Reason for referral: varicose veins

## 2019-02-27 NOTE — Telephone Encounter (Signed)
Pt has not been seen in a while will need appointment if needs referral.  Other wise she can try calling herself

## 2019-03-02 ENCOUNTER — Other Ambulatory Visit: Payer: Self-pay

## 2019-03-02 ENCOUNTER — Encounter: Payer: Self-pay | Admitting: Internal Medicine

## 2019-03-02 ENCOUNTER — Ambulatory Visit (INDEPENDENT_AMBULATORY_CARE_PROVIDER_SITE_OTHER): Payer: BC Managed Care – PPO | Admitting: Internal Medicine

## 2019-03-02 VITALS — BP 120/90 | HR 73 | Ht 65.0 in | Wt 138.5 lb

## 2019-03-02 DIAGNOSIS — I340 Nonrheumatic mitral (valve) insufficiency: Secondary | ICD-10-CM

## 2019-03-02 DIAGNOSIS — R030 Elevated blood-pressure reading, without diagnosis of hypertension: Secondary | ICD-10-CM | POA: Diagnosis not present

## 2019-03-02 NOTE — Telephone Encounter (Signed)
lvm for pt to schedule appointment for referral, or she can call them and see if they are able to see her without a referral

## 2019-03-02 NOTE — Patient Instructions (Signed)
Please monitor your blood pressure and heart rate at home a couple times a week. Let us know if it runs consistently greater than 140/90.  Medication Instructions:  Your physician recommends that you continue on your current medications as directed. Please refer to the Current Medication list given to you today.  *If you need a refill on your cardiac medications before your next appointment, please call your pharmacy*  Lab Work: none If you have labs (blood work) drawn today and your tests are completely normal, you will receive your results only by: Marland Kitchen MyChart Message (if you have MyChart) OR . A paper copy in the mail If you have any lab test that is abnormal or we need to change your treatment, we will call you to review the results.  Testing/Procedures: none  Follow-Up: At [facility], you and your health needs are our priority.  As part of our continuing mission to provide you with exceptional heart care, we have created designated Provider Care Teams.  These Care Teams include your primary Cardiologist (physician) and Advanced Practice Providers (APPs -  Physician Assistants and Nurse Practitioners) who all work together to provide you with the care you need, when you need it.  We recommend signing up for the patient portal called "MyChart".  Sign up information is provided on this After Visit Summary.  MyChart is used to connect with patients for Virtual Visits (Telemedicine).  Patients are able to view lab/test results, encounter notes, upcoming appointments, etc.  Non-urgent messages can be sent to your provider as well.   To learn more about what you can do with MyChart, go to [email or web address].    Your next appointment:   12 month(s)  The format for your next appointment:   In Person  Provider:    You may see DR Harrell Gave END or one of the following Advanced Practice Providers on your designated Care Team:    Murray Hodgkins, NP  Christell Faith, PA-C  Marrianne Mood, PA-C

## 2019-03-02 NOTE — Progress Notes (Signed)
Follow-up Outpatient Visit Date: 03/02/2019  Primary Care Provider: Arnetha Courser, MD [address] [place] [address]  Chief Complaint: Follow-up heart murmur  HPI:  Kaitlyn Harvey is a 44 y.o. female with history of neck pain secondary to arthritis, who presents for follow-up of heart murmur.  She was seen once in our office in 05/2016 for assessment of heart murmur, which she reported dates back to her childhood.  She was asymptomatic other than occasional palpitations.  Echocardiogram showed mild mitral regurgitation but otherwise no significant abnormality.  Today, Kaitlyn Harvey reports that she has been feeling well.  She notes a single episode during which she felt nervous at work about 2 weeks ago.  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  She otherwise has not checked her blood pressure.  She denies chest pain, shortness of breath, palpitations, and lightheadedness.  She notes intermittent pain along her left lateral thigh that she attributes to varicose veins.  She is scheduled to be seen by a vascular specialist for further evaluation.  --------------------------------------------------------------------------------------------------  Cardiovascular History & Procedures: Cardiovascular Problems:  Heart murmur  Palpitations  Risk Factors:  None  Cath/PCI:  None  CV Surgery:  None  EP Procedures and Devices:  None  Non-Invasive Evaluation(s):  TTE (07/11/2016): Normal LV size with LVEF 60-65% and normal diastolic function.  Mild mitral regurgitation.  Normal RV size and function.  Normal PA pressure.  Recent CV Pertinent Labs: Lab Results  Component Value Date   CHOL 178 04/09/2017   CHOL 169 03/08/2015   HDL 105 04/09/2017   HDL 112 03/08/2015   LDLCALC 54 04/09/2017   TRIG 105 04/09/2017   CHOLHDL 1.7 04/09/2017   K 4.2 04/09/2017   BUN 15 04/09/2017   BUN 11 03/08/2015   CREATININE 0.79  04/09/2017    Past medical and surgical history were reviewed and updated in EPIC.  Current Meds  Medication Sig  . acetaminophen (TYLENOL) 500 MG tablet Take 500 mg by mouth every 6 (six) hours as needed.  . Biotin 10 MG CAPS Take 1 capsule by mouth daily.  . calcium-vitamin D (OSCAL WITH D) 500-200 MG-UNIT tablet Take 1 tablet by mouth.  Marland Kitchen ibuprofen (ADVIL,MOTRIN) 600 MG tablet Take 1 tablet (600 mg total) by mouth every 6 (six) hours as needed. Take with food; stop if abd pain or blood in stool  . Multiple Vitamin (MULTIVITAMIN) tablet Take 1 tablet by mouth daily.    Allergies: Patient has no known allergies.  Social History   Tobacco Use  . Smoking status: Former Smoker    Packs/day: 0.50    Years: 20.00    Pack years: 10.00    Types: Cigarettes    Start date: 12/20/1994    Quit date: 06/09/2015    Years since quitting: 3.7  . Smokeless tobacco: Never Used  Substance Use Topics  . Alcohol use: Yes    Alcohol/week: 2.0 standard drinks    Types: 2 Glasses of wine per week  . Drug use: No    Family History  Problem Relation Age of Onset  . Asthma Mother   . Hypertension Mother   . Thyroid disease Mother   . Other Mother        dilatation of ascending aorta  . Heart attack Father 41  . Hypertension Brother   . Bell's palsy Brother   . Heart murmur Brother   . Breast cancer  Neg Hx     Review of Systems: A 12-system review of systems was performed and was negative except as noted in the HPI.  --------------------------------------------------------------------------------------------------  Physical Exam: BP 120/90 (BP Location: Left Arm, Patient Position: Sitting, Cuff Size: Normal)   Pulse 73   Ht 5' 5"  (1.651 m)   Wt 138 lb 8 oz (62.8 kg)   SpO2 96%   BMI 23.05 kg/m   BP: 128/86  General:  NAD. Neck: No JVD. Lungs: CTAB. Heart: RRR w/o murmurs, rubs, or gallops. Abdomen: Soft, NT/ND Ext: No LE edema.  2+ pedal pulses bilaterally.  EKG:  NSR w/o  abnormality.  Lab Results  Component Value Date   WBC 6.0 04/09/2017   HGB 13.2 04/09/2017   HCT 38.7 04/09/2017   MCV 97.2 04/09/2017   PLT 284 04/09/2017    Lab Results  Component Value Date   NA 138 04/09/2017   K 4.2 04/09/2017   CL 103 04/09/2017   CO2 28 04/09/2017   BUN 15 04/09/2017   CREATININE 0.79 04/09/2017   GLUCOSE 83 04/09/2017   ALT 14 04/09/2017    Lab Results  Component Value Date   CHOL 178 04/09/2017   HDL 105 04/09/2017   LDLCALC 54 04/09/2017   TRIG 105 04/09/2017   CHOLHDL 1.7 04/09/2017    --------------------------------------------------------------------------------------------------  ASSESSMENT AND PLAN: Mitral regurgitation: No signs or symptoms or heart failure.  No murmur appreciated on exam today.   Echo in 2018 was notable for mild MR.  Given lack of symptoms, I do not feel that repeat echo is indicated at this time.  We will continue clinical follow-up.  Importance of blood pressure control was reinforces.  Elevated blood pressure: Kaitlyn Harvey does not carry a diagnosis of hypertension but has had mildly elevated pressures over the last few weeks (2 weeks ago at work/school and initially in the office today). We have dicussed lifestyle modifications, including sodium restriction.  We will defer pharmacotherapy at this time.  Follow-up: Return to clinic in 1 year.  Nelva Bush, MD 03/02/2019 9:57 PM

## 2019-03-09 ENCOUNTER — Encounter: Payer: Self-pay | Admitting: Student in an Organized Health Care Education/Training Program

## 2019-03-11 ENCOUNTER — Ambulatory Visit
Payer: BC Managed Care – PPO | Attending: Student in an Organized Health Care Education/Training Program | Admitting: Student in an Organized Health Care Education/Training Program

## 2019-03-11 ENCOUNTER — Other Ambulatory Visit: Payer: Self-pay

## 2019-03-11 DIAGNOSIS — M5136 Other intervertebral disc degeneration, lumbar region: Secondary | ICD-10-CM | POA: Diagnosis not present

## 2019-03-11 DIAGNOSIS — M47816 Spondylosis without myelopathy or radiculopathy, lumbar region: Secondary | ICD-10-CM

## 2019-03-11 NOTE — Progress Notes (Signed)
Patient: Kaitlyn Harvey  Service Category: E/M  Provider: Gillis Santa, MD  DOB: 1975-12-20  DOS: 03/11/2019  Location: Office  MRN: [id number]  Setting: Ambulatory outpatient  Referring Provider: Arnetha Courser, MD  Type: Established Patient  Specialty: Interventional Pain Management  PCP: Kaitlyn Courser, MD  Location: Home  Delivery: TeleHealth     Virtual Encounter - Pain Management PROVIDER NOTE: Information contained herein reflects review and annotations entered in association with encounter. Interpretation of such information and data should be left to medically-trained personnel. Information provided to patient can be located elsewhere in the medical record under "Patient Instructions". Document created using STT-dictation technology, any transcriptional errors that may result from process are unintentional.    Contact & Pharmacy Preferred: [phone] Home: [phone] (home) Mobile: [phone] (mobile) E-mail: No e-mail address on record  [id number] [place]Phone: [phone]: [phone]  Pre-screening  Ms. Kaitlyn Harvey offered "in-person" vs "virtual" encounter. She indicated preferring virtual for this encounter.   Reason COVID-19*  Social distancing based on CDC and AMA recommendations.   I contacted AAngeline Slimon 03/11/2019 via telephone.      I clearly identified myself as BGillis Santa MD. I verified that I was speaking with the correct person using two identifiers (Name: AKaliyah Harvey and date of birth: [id number].  This visit was completed via telephone due to the restrictions of the COVID-19 pandemic. All issues as above were discussed and addressed but no physical exam was performed. If it was felt that the patient should be evaluated in the office, they were directed there. The patient verbally consented to this visit. Patient was unable to complete an audio/visual visit due to Technical  difficulties and/or Lack of internet. Due to the catastrophic nature of the COVID-19 pandemic, this visit was done through audio contact only.  Location of the patient: home address (see Epic for details)  Location of the provider: office  Consent I sought verbal advanced consent from AAngeline Slimfor virtual visit interactions. I informed Ms. Kaitlyn Harvey of possible security and privacy concerns, risks, and limitations associated with providing "not-in-person" medical evaluation and management services. I also informed Ms. Kaitlyn Harvey of the availability of "in-person" appointments. Finally, I informed her that there would be a charge for the virtual visit and that she could be  personally, fully or partially, financially responsible for it. Ms. PBritainexpressed understanding and agreed to proceed.   Historic Elements   Ms. AVerniece Harvey a 44y.o. year old, female patient evaluated today after her last contact with our practice on 01/15/2019. Ms. PZiesmer has a past medical history of Arthritis and Thoracic compression fracture, sequela (12/24/2017). She also  has a past surgical history that includes Upper gi endoscopy and Upper gastrointestinal endoscopy (2008). Ms. PBeumerhas a current medication list which includes the following prescription(s): acetaminophen, biotin, calcium-vitamin d, ibuprofen, and multivitamin. She  reports that she quit smoking about 3 years ago. Her smoking use included cigarettes. She started smoking about 24 years ago. She has a 10.00 pack-year smoking history. She has never used smokeless tobacco. She reports current alcohol use of about 2.0 standard drinks of alcohol per week. She reports that she does not use drugs. Ms. PLeitzhas No Known Allergies.   HPI  Today, she is being contacted for a post-procedure assessment.  Evaluation of last interventional procedure  01/14/2019 Procedure:    LEFT L3,4,5 RFA 01/14/19 Right L3,4,5 RFA  12/03/18  Sedation: Please see nurses note for DOS. When no  sedatives are used, the analgesic levels obtained are directly associated to the effectiveness of the local anesthetics. However, when sedation is provided, the level of analgesia obtained during the initial 1 hour following the intervention, is believed to be the result of a combination of factors. These factors may include, but are not limited to: 1. The effectiveness of the local anesthetics used. 2. The effects of the analgesic(s) and/or anxiolytic(s) used. 3. The degree of discomfort experienced by the patient at the time of the procedure. 4. The patients ability and reliability in recalling and recording the events. 5. The presence and influence of possible secondary gains and/or psychosocial factors. Reported result: Relief experienced during the 1st hour after the procedure: 100 % (Ultra-Short Term Relief)            Interpretative annotation: Clinically appropriate result. Analgesia during this period is likely to be Local Anesthetic and/or IV Sedative (Analgesic/Anxiolytic) related.          Effects of local anesthetic: The analgesic effects attained during this period are directly associated to the localized infiltration of local anesthetics and therefore cary significant diagnostic value as to the etiological location, or anatomical origin, of the pain. Expected duration of relief is directly dependent on the pharmacodynamics of the local anesthetic used. Long-acting (4-6 hours) anesthetics used.  Reported result: Relief during the next 4 to 6 hour after the procedure: 100 % (Short-Term Relief)            Interpretative annotation: Clinically appropriate result. Analgesia during this period is likely to be Local Anesthetic-related.          Long-term benefit: Defined as the period of time past the expected duration of local anesthetics (1 hour for short-acting and 4-6 hours for long-acting). With the possible exception of prolonged sympathetic blockade from the local anesthetics, benefits  during this period are typically attributed to, or associated with, other factors such as analgesic sensory neuropraxia, antiinflammatory effects, or beneficial biochemical changes provided by agents other than the local anesthetics.  Reported result: Extended relief following procedure: 50 % (Long-Term Relief)            Interpretative annotation: Clinically appropriate result. Partial relief.       Benefit could signal adequate RF ablation.  Laboratory Chemistry Profile   Renal Lab Results  Component Value Date   BUN 15 04/09/2017   CREATININE 0.79 91/50/5697   BCR NOT APPLICABLE 94/80/1655   GFRAA 106 04/09/2017   GFRNONAA 91 04/09/2017    Hepatic Lab Results  Component Value Date   AST 28 04/09/2017   ALT 14 04/09/2017   ALBUMIN 4.6 03/05/2016   ALKPHOS 36 03/05/2016    Electrolytes Lab Results  Component Value Date   NA 138 04/09/2017   K 4.2 04/09/2017   CL 103 04/09/2017   CALCIUM 9.6 04/09/2017    Bone Lab Results  Component Value Date   VD25OH 32 03/05/2016    Inflammation (CRP: Acute Phase) (ESR: Chronic Phase) No results found for: CRP, ESRSEDRATE, LATICACIDVEN    Note: Above Lab results reviewed.   Assessment  The primary encounter diagnosis was Lumbar facet arthropathy. Diagnoses of Lumbar spondylosis and Lumbar degenerative disc disease (modeate L4/L5) were also pertinent to this visit.  Plan of Care   Ms. Kaitlyn Harvey has a current medication list which includes the following long-term medication(s): calcium-vitamin d.  Successful lumbar radiofrequency ablation with improvement of pain.  Patient is also performing yoga and is also exercising to help manage her chronic pain.  Follow-up as needed.  Follow-up plan:   Return if symptoms worsen or fail to improve.     Status post right L3, L4, L5 RFA on 12/03/2018, left L3, 4,5 RFA on 01/14/2019    Recent Visits Date Type Provider Dept  01/14/19 Procedure visit Gillis Santa, MD [facility]   Showing recent visits within past 90 days and meeting all other requirements   Today's Visits Date Type Provider Dept  03/11/19 Office Visit Gillis Santa, MD [facility]  Showing today's visits and meeting all other requirements   Future Appointments No visits were found meeting these conditions.  Showing future appointments within next 90 days and meeting all other requirements   I discussed the assessment and treatment plan with the patient. The patient was provided an opportunity to ask questions and all were answered. The patient agreed with the plan and demonstrated an understanding of the instructions.  Patient advised to call back or seek an in-person evaluation if the symptoms or condition worsens.  Duration of encounter: 15 minutes.  Note by: Gillis Santa, MD Date: 03/11/2019; Time: 12:05 PM

## 2019-03-30 ENCOUNTER — Encounter: Payer: Self-pay | Admitting: Family Medicine

## 2019-03-30 ENCOUNTER — Telehealth (INDEPENDENT_AMBULATORY_CARE_PROVIDER_SITE_OTHER): Payer: BC Managed Care – PPO | Admitting: Family Medicine

## 2019-03-30 VITALS — Ht 65.0 in | Wt 135.0 lb

## 2019-03-30 DIAGNOSIS — I83812 Varicose veins of left lower extremities with pain: Secondary | ICD-10-CM

## 2019-03-30 NOTE — Progress Notes (Signed)
Name: Kaitlyn Harvey   MRN: [id number]    DOB: 02/02/1975   Date:03/30/2019       Progress Note  Subjective:    Chief Complaint  Chief Complaint  Patient presents with  . Referral    vascular   . Varicose Veins    left leg    I connected with  [name]  on 03/30/19 at  2:00 PM EDT by a video enabled telemedicine application and verified that I am speaking with the correct person using two identifiers.  I discussed the limitations of evaluation and management by telemedicine and the availability of in person appointments. The patient expressed understanding and agreed to proceed. Staff also discussed with the patient that there may be a patient responsible charge related to this service. Patient Location: work Engineer, structural:  [facility]  Additional Individuals present: none  HPI   Pt presents for request for referral to vascular specialist  She complains of varicose veins and reticular veins to left thigh lateral side and to left calf and back of knee, they ache, are large, are painful.  She did a vascular eval in the past but never followed through she did go to vascular specialist [facility] at [facility].  She is okay to follow-up there.  She does not have any ulcers sores or lower extremity edema.    Patient Active Problem List   Diagnosis Date Noted  . Elevated blood-pressure reading without diagnosis of hypertension 03/02/2019  . Lumbar facet arthropathy 02/25/2018  . Lumbar spondylosis 02/25/2018  . Chronic pain syndrome 02/25/2018  . Compression fracture of T9 vertebra ([place]) 12/24/2017  . Murmur 05/09/2016  . Palpitations 05/09/2016  . Lumbar degenerative disc disease 08/09/2015  . Facet syndrome, lumbar 08/09/2015  . 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 Tobacco abuse 02/28/2015    Social History   Tobacco Use  . Smoking status: Former Smoker    Packs/day: 0.50    Years: 20.00    Pack years: 10.00    Types: Cigarettes    Start date: 12/20/1994    Quit  date: 06/09/2015    Years since quitting: 3.8  . Smokeless tobacco: Never Used  Substance Use Topics  . Alcohol use: Yes    Alcohol/week: 2.0 standard drinks    Types: 2 Glasses of wine per week     Current Outpatient Medications:  .  acetaminophen (TYLENOL) 500 MG tablet, Take 500 mg by mouth every 6 (six) hours as needed., Disp: , Rfl:  .  calcium-vitamin D (OSCAL WITH D) 500-200 MG-UNIT tablet, Take 1 tablet by mouth., Disp: , Rfl:  .  ibuprofen (ADVIL,MOTRIN) 600 MG tablet, Take 1 tablet (600 mg total) by mouth every 6 (six) hours as needed. Take with food; stop if abd pain or blood in stool, Disp: 40 tablet, Rfl: 0 .  Multiple Vitamin (MULTIVITAMIN) tablet, Take 1 tablet by mouth daily., Disp: , Rfl:  .  Biotin 10 MG CAPS, Take 1 capsule by mouth daily., Disp: , Rfl:   No Known Allergies  I personally reviewed active problem list, medication list, allergies, family history, social history, health maintenance, notes from last encounter, lab results, imaging with the patient/caregiver today.   Review of Systems  10 Systems reviewed and are negative for acute change except as noted in the HPI.   Objective:   Virtual encounter, vitals limited, only able to obtain the following Today's Vitals   03/30/19 1408  Weight: 135 lb (61.2 kg)  Height: 5' 5"  (1.651  m)   Body mass index is 22.47 kg/m. Nursing Note and Vital Signs reviewed.  Physical Exam Well-appearing alert female unable to show me her lower extremities or varicosities PE limited by telephone encounter  No results found for this or any previous visit (from the past 72 hour(s)).  Assessment and Plan:     ICD-10-CM   1. Varicose veins of left lower extremity with pain  I83.812 Ambulatory referral to Vascular Surgery  Patient endorses chronic engorged varicose veins that cause pain would like to follow-up with vascular surgery  -Red flags and when to present for emergency care or RTC including fever >101.70F, chest  pain, shortness of breath, new/worsening/un-resolving symptoms, reviewed with patient at time of visit. Follow up and care instructions discussed and provided in AVS. - I discussed the assessment and treatment plan with the patient. The patient was provided an opportunity to ask questions and all were answered. The patient agreed with the plan and demonstrated an understanding of the instructions.  I provided 15 minutes of non-face-to-face time during this encounter.  Delsa Grana, PA-C 03/30/19 2:35 PM

## 2019-05-01 ENCOUNTER — Other Ambulatory Visit: Payer: Self-pay | Admitting: Family Medicine

## 2019-05-01 DIAGNOSIS — Z1231 Encounter for screening mammogram for malignant neoplasm of breast: Secondary | ICD-10-CM

## 2019-06-04 ENCOUNTER — Ambulatory Visit
Admission: RE | Admit: 2019-06-04 | Discharge: 2019-06-04 | Disposition: A | Discharge status: Home / Self Care | Payer: BC Managed Care – PPO | Source: Ambulatory Visit | Attending: Family Medicine | Admitting: Family Medicine

## 2019-06-04 DIAGNOSIS — Z1231 Encounter for screening mammogram for malignant neoplasm of breast: Secondary | ICD-10-CM | POA: Insufficient documentation

## 2019-08-25 ENCOUNTER — Encounter: Payer: Self-pay | Admitting: Family Medicine

## 2019-08-25 ENCOUNTER — Other Ambulatory Visit: Payer: Self-pay

## 2019-08-25 ENCOUNTER — Ambulatory Visit (INDEPENDENT_AMBULATORY_CARE_PROVIDER_SITE_OTHER): Payer: BC Managed Care – PPO | Admitting: Family Medicine

## 2019-08-25 VITALS — BP 110/78 | HR 66 | Temp 98.1°F | Resp 18 | Ht 65.0 in | Wt 143.6 lb

## 2019-08-25 DIAGNOSIS — Z1211 Encounter for screening for malignant neoplasm of colon: Secondary | ICD-10-CM | POA: Diagnosis not present

## 2019-08-25 DIAGNOSIS — Z0000 Encounter for general adult medical examination without abnormal findings: Secondary | ICD-10-CM | POA: Diagnosis not present

## 2019-08-25 DIAGNOSIS — Z83438 Family history of other disorder of lipoprotein metabolism and other lipidemia: Secondary | ICD-10-CM

## 2019-08-25 DIAGNOSIS — Z13220 Encounter for screening for lipoid disorders: Secondary | ICD-10-CM

## 2019-08-25 DIAGNOSIS — Z8349 Family history of other endocrine, nutritional and metabolic diseases: Secondary | ICD-10-CM | POA: Diagnosis not present

## 2019-08-25 DIAGNOSIS — Z23 Encounter for immunization: Secondary | ICD-10-CM | POA: Diagnosis not present

## 2019-08-25 NOTE — Progress Notes (Signed)
Patient: Kaitlyn Harvey, Female    DOB: 1975-04-07, 44 y.o.   MRN: [id number] Delsa Grana, PA-C Visit Date: 08/25/2019  Today's Provider: Delsa Grana, PA-C   Chief Complaint  Patient presents with  . Annual Exam   Subjective:   Annual physical exam:  Kaitlyn Harvey is a 44 y.o. female who presents today for complete physical exam:  Exercise/Activity: 3 times a week 30 minutes a day  Diet/nutrition:  eat plenty of vegetables, protein, fish  Sleep: 7-8 hours of sleep  Vein issues tx by [facility] She changed jobs and she feels much less stressed, her BP is usually high but much better today   USPSTF grade A and B recommendations - reviewed and addressed today  Depression:  Phq 9 completed today by patient, was reviewed by me with patient in the room PHQ score is neg, pt feels good PHQ 2/9 Scores 08/25/2019 03/30/2019 08/19/2018 03/12/2018  PHQ - 2 Score 0 0 0 0  PHQ- 9 Score 0 0 0 0   Depression screen [facility] 2/9 08/25/2019 03/30/2019 08/19/2018 03/12/2018 03/04/2018  Decreased Interest 0 0 0 0 0  Down, Depressed, Hopeless 0 0 0 0 0  PHQ - 2 Score 0 0 0 0 0  Altered sleeping 0 0 0 0 0  Tired, decreased energy 0 0 0 0 0  Change in appetite 0 0 0 0 0  Feeling bad or failure about yourself  0 0 0 0 0  Trouble concentrating 0 0 0 0 0  Moving slowly or fidgety/restless 0 0 0 0 0  Suicidal thoughts 0 0 0 0 0  PHQ-9 Score 0 0 0 0 0  Difficult doing work/chores Not difficult at all Not difficult at all Not difficult at all Not difficult at all Not difficult at all    Alcohol screening:   Video Visit from 03/30/2019 in [facility]  AUDIT-C Score 0      Immunizations and Health Maintenance: Health Maintenance  Topic Date Due  . MAMMOGRAM  06/03/2020  . PAP SMEAR-Modifier  08/18/2021  . TETANUS/TDAP  04/10/2027  . INFLUENZA VACCINE  Completed  . COVID-19 Vaccine  Completed  . Hepatitis C Screening  Completed  . HIV Screening  Completed     Hep C  Screening: done  STD testing and prevention (HIV/chl/gon/syphilis):  see above, no additional testing desired by pt today - none  Intimate partner violence: safe  Sexual History/Pain during Intercourse: Married  - no concerns  Menstrual History/LMP/Abnormal Bleeding:  Regular, not heavy Patient's last menstrual period was 07/22/2019.  Incontinence Symptoms: none  Breast cancer:   UTD, just done and reviewed Last Mammogram: *see HM list above BRCA gene screening:    Cervical cancer screening: UTD Pt  family hx of cancers - breast, ovarian, uterine, colon:     Osteoporosis:   Discussion on osteoporosis per age, including high calcium and vitamin D supplementation, weight bearing exercises  Skin cancer:  Hx of skin CA -  NO  Discussed atypical lesions   Colorectal cancer:     Colonoscopy is due - for age 24 Discussed concerning signs and sx of CRC, pt denies melena, hematochezia or BM changes  Lung cancer:   Low Dose CT Chest recommended if Age 29-80 years, 30 pack-year currently smoking OR have quit w/in 15years. Patient does not qualify.     Social History   Tobacco Use  . Smoking status: Former Smoker    Packs/day: 0.50  Years: 20.00    Pack years: 10.00    Types: Cigarettes    Start date: 12/20/1994    Quit date: 06/09/2015    Years since quitting: 4.2  . Smokeless tobacco: Never Used  Vaping Use  . Vaping Use: Never used  Substance Use Topics  . Alcohol use: Yes    Alcohol/week: 2.0 standard drinks    Types: 2 Glasses of wine per week  . Drug use: No       [facility] 03/30/[facility]  AUDIT-C Score 0      Family History  Problem Relation Age of Onset  . Asthma Mother   . Hypertension Mother   . Thyroid disease Mother   . Other Mother        dilatation of ascending aorta  . Heart attack Father 70  . Hypertension Brother   . Bell's palsy Brother   . Heart murmur Brother   . Breast cancer Neg Hx      Blood  pressure/Hypertension: BP Readings from Last 3 Encounters:  08/25/19 110/78  03/02/19 120/90  01/14/19 115/83    Weight/Obesity: Wt Readings from Last 3 Encounters:  08/25/19 143 lb 9.6 oz (65.1 kg)  03/30/19 135 lb (61.2 kg)  03/02/19 138 lb 8 oz (62.8 kg)   BMI Readings from Last 3 Encounters:  08/25/19 23.90 kg/m  03/30/19 22.47 kg/m  03/02/19 23.05 kg/m     Lipids:  Lab Results  Component Value Date   CHOL 178 04/09/2017   CHOL 201 (H) 03/05/2016   CHOL 169 03/08/2015   Lab Results  Component Value Date   HDL 105 04/09/2017   HDL 123 03/05/2016   HDL 112 03/08/2015   Lab Results  Component Value Date   LDLCALC 54 04/09/2017   LDLCALC 70 03/05/2016   LDLCALC 42 03/08/2015   Lab Results  Component Value Date   TRIG 105 04/09/2017   TRIG 41 03/05/2016   TRIG 77 03/08/2015   Lab Results  Component Value Date   CHOLHDL 1.7 04/09/2017   CHOLHDL 1.6 03/05/2016   CHOLHDL 1.5 03/08/2015   No results found for: LDLDIRECT Based on the results of lipid panel his/her cardiovascular risk factor ( using [facility] )  in the next 10 years is: The ASCVD Risk score Mikey Bussing DC Jr., et al., 2013) failed to calculate for the following reasons:   The valid HDL cholesterol range is 20 to 100 mg/dL Glucose:  Glucose  Date Value Ref Range Status  03/08/2015 80 65 - 99 mg/dL Final   Glucose, Bld  Date Value Ref Range Status  04/09/2017 83 65 - 139 mg/dL Final    Comment:    .        Non-fasting reference interval .   03/05/2016 83 65 - 99 mg/dL Final   Hypertension: BP Readings from Last 3 Encounters:  08/25/19 110/78  03/02/19 120/90  01/14/19 115/83   Obesity: Wt Readings from Last 3 Encounters:  08/25/19 143 lb 9.6 oz (65.1 kg)  03/30/19 135 lb (61.2 kg)  03/02/19 138 lb 8 oz (62.8 kg)   BMI Readings from Last 3 Encounters:  08/25/19 23.90 kg/m  03/30/19 22.47 kg/m  03/02/19 23.05 kg/m      Advanced Care Planning:  A voluntary discussion  about advance care planning including the explanation and discussion of advance directives.   Discussed health care proxy and Living will, and the patient was able to identify a health care  proxy as [name].   Patient does not have a living will at present time.   Social History      She        Social History   Socioeconomic History  . Marital status: Married    Spouse name: Joneen Boers  . Number of children: 1  . Years of education: Not on file  . Highest education level: Not on file  Occupational History  . Occupation: Pharmacist, hospital  Tobacco Use  . Smoking status: Former Smoker    Packs/day: 0.50    Years: 20.00    Pack years: 10.00    Types: Cigarettes    Start date: 12/20/1994    Quit date: 06/09/2015    Years since quitting: 4.2  . Smokeless tobacco: Never Used  Vaping Use  . Vaping Use: Never used  Substance and Sexual Activity  . Alcohol use: Yes    Alcohol/week: 2.0 standard drinks    Types: 2 Glasses of wine per week  . Drug use: No  . Sexual activity: Yes    Partners: Male    Birth control/protection: None    Comment: husband has a vasectomy  Other Topics Concern  . Not on file  Social History Narrative  . Not on file   Social Determinants of Health   Financial Resource Strain:   . Difficulty of Paying Living Expenses: Not on file  Food Insecurity:   . Worried About Charity fundraiser in the Last Year: Not on file  . Ran Out of Food in the Last Year: Not on file  Transportation Needs:   . Lack of Transportation (Medical): Not on file  . Lack of Transportation (Non-Medical): Not on file  Physical Activity:   . Days of Exercise per Week: Not on file  . Minutes of Exercise per Session: Not on file  Stress:   . Feeling of Stress : Not on file  Social Connections:   . Frequency of Communication with Friends and Family: Not on file  . Frequency of Social Gatherings with Friends and Family: Not on file  . Attends Religious Services: Not on file  . Active  Member of Clubs or Organizations: Not on file  . Attends Archivist Meetings: Not on file  . Marital Status: Not on file    Family History        Family History  Problem Relation Age of Onset  . Asthma Mother   . Hypertension Mother   . Thyroid disease Mother   . Other Mother        dilatation of ascending aorta  . Heart attack Father 73  . Hypertension Brother   . Bell's palsy Brother   . Heart murmur Brother   . Breast cancer Neg Hx     Patient Active Problem List   Diagnosis Date Noted  . Elevated blood-pressure reading without diagnosis of hypertension 03/02/2019  . Lumbar facet arthropathy 02/25/2018  . Lumbar spondylosis 02/25/2018  . Chronic pain syndrome 02/25/2018  . Compression fracture of T9 vertebra ([place]) 12/24/2017  . Murmur 05/09/2016  . Palpitations 05/09/2016  . Lumbar degenerative disc disease 08/09/2015  . Facet syndrome, lumbar 08/09/2015  . 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 Tobacco abuse 02/28/2015    Past Surgical History:  Procedure Laterality Date  . UPPER GASTROINTESTINAL ENDOSCOPY  2008  . UPPER GI ENDOSCOPY       Current Outpatient Medications:  .  acetaminophen (TYLENOL) 500 MG tablet, Take 500 mg by mouth  every 6 (six) hours as needed., Disp: , Rfl:  .  Biotin 10 MG CAPS, Take 1 capsule by mouth daily., Disp: , Rfl:  .  calcium-vitamin D (OSCAL WITH D) 500-200 MG-UNIT tablet, Take 1 tablet by mouth., Disp: , Rfl:  .  ibuprofen (ADVIL,MOTRIN) 600 MG tablet, Take 1 tablet (600 mg total) by mouth every 6 (six) hours as needed. Take with food; stop if abd pain or blood in stool, Disp: 40 tablet, Rfl: 0 .  Multiple Vitamin (MULTIVITAMIN) tablet, Take 1 tablet by mouth daily., Disp: , Rfl:   No Known Allergies  Patient Care Team: Delsa Grana, PA-C as PCP - General (Family Medicine) End, Harrell Gave, MD as Consulting Physician (Cardiology)  Review of Systems  Constitutional: Negative.   HENT: Negative.   Eyes:  Negative.   Respiratory: Negative.   Cardiovascular: Negative.   Gastrointestinal: Negative.   Endocrine: Negative.   Genitourinary: Negative.   Musculoskeletal: Negative.   Skin: Negative.   Allergic/Immunologic: Negative.   Neurological: Negative.   Hematological: Negative.   Psychiatric/Behavioral: Negative.   All other systems reviewed and are negative.    I personally reviewed active problem list, medication list, allergies, family history, social history, health maintenance, notes from last encounter, lab results, imaging with the patient/caregiver today.         Objective:   Vitals:  Vitals:   08/25/19 1452  BP: 110/78  Pulse: 66  Resp: 18  Temp: 98.1 F (36.7 C)  SpO2: 96%  Weight: 143 lb 9.6 oz (65.1 kg)  Height: 5' 5"  (1.651 m)    Body mass index is 23.9 kg/m.  Physical Exam Vitals and nursing note reviewed.  Constitutional:      General: She is not in acute distress.    Appearance: Normal appearance. She is well-developed. She is not ill-appearing, toxic-appearing or diaphoretic.  HENT:     Head: Normocephalic and atraumatic.     Right Ear: External ear normal. There is no impacted cerumen.     Left Ear: External ear normal. There is no impacted cerumen.     Mouth/Throat:     Pharynx: Uvula midline.  Eyes:     General: Lids are normal.     Conjunctiva/sclera: Conjunctivae normal.     Pupils: Pupils are equal, round, and reactive to light.  Neck:     Thyroid: No thyroid mass, thyromegaly or thyroid tenderness.     Trachea: Trachea and phonation normal. No tracheal deviation.  Cardiovascular:     Rate and Rhythm: Normal rate and regular rhythm.     Pulses: Normal pulses.          Radial pulses are 2+ on the right side and 2+ on the left side.       Posterior tibial pulses are 2+ on the right side and 2+ on the left side.     Heart sounds: Normal heart sounds. No murmur heard.  No friction rub. No gallop.   Pulmonary:     Effort: Pulmonary  effort is normal. No respiratory distress.     Breath sounds: Normal breath sounds. No stridor. No wheezing, rhonchi or rales.  Chest:     Chest wall: No tenderness.  Abdominal:     General: Bowel sounds are normal. There is no distension.     Palpations: Abdomen is soft.     Tenderness: There is no abdominal tenderness. There is no guarding or rebound.  Musculoskeletal:     Cervical back: Normal range of motion and  neck supple.     Right lower leg: No edema.     Left lower leg: No edema.  Lymphadenopathy:     Cervical: No cervical adenopathy.  Skin:    General: Skin is warm and dry.     Capillary Refill: Capillary refill takes less than 2 seconds.     Coloration: Skin is not pale.     Findings: No rash.  Neurological:     Mental Status: She is alert. Mental status is at baseline.     Motor: No abnormal muscle tone.     Gait: Gait normal.  Psychiatric:        Mood and Affect: Mood normal.        Speech: Speech normal.        Behavior: Behavior normal.       Fall Risk: Fall Risk  08/25/2019 03/30/2019 01/14/2019 12/03/2018 10/01/2018  Falls in the past year? 0 0 0 1 0  Number falls in past yr: 0 0 - 0 -  Injury with Fall? 0 0 - 1 -  Risk for fall due to : - - - History of fall(s) -  Follow up Falls evaluation completed - Falls evaluation completed Education provided;Falls evaluation completed;Falls prevention discussed Falls evaluation completed    Functional Status Survey: Is the patient deaf or have difficulty hearing?: No Does the patient have difficulty seeing, even when wearing glasses/contacts?: No Does the patient have difficulty concentrating, remembering, or making decisions?: No Does the patient have difficulty walking or climbing stairs?: No Does the patient have difficulty dressing or bathing?: No Does the patient have difficulty doing errands alone such as visiting a doctor's office or shopping?: No   Assessment & Plan:    CPE completed today  . USPSTF  grade A and B recommendations reviewed with patient; age-appropriate recommendations, preventive care, screening tests, etc discussed and encouraged; healthy living encouraged; see AVS for patient education given to patient  . Discussed importance of 150 minutes of physical activity weekly, AHA exercise recommendations given to pt in AVS/handout  . Discussed importance of healthy diet:  eating lean meats and proteins, avoiding trans fats and saturated fats, avoid simple sugars and excessive carbs in diet, eat 6 servings of fruit/vegetables daily and drink plenty of water and avoid sweet beverages.    . Recommended pt to do annual eye exam and routine dental exams/cleanings  . Depression, alcohol, fall screening completed as documented above and per flowsheets  . Reviewed Health Maintenance: Health Maintenance  Topic Date Due  . MAMMOGRAM  06/03/2020  . PAP SMEAR-Modifier  08/18/2021  . TETANUS/TDAP  04/10/2027  . INFLUENZA VACCINE  Completed  . COVID-19 Vaccine  Completed  . Hepatitis C Screening  Completed  . HIV Screening  Completed    . Immunizations: Immunization History  Administered Date(s) Administered  . Influenza,inj,Quad PF,6+ Mos 11/12/2014, 11/06/2016, 10/15/2018, 08/25/2019  . Influenza-Unspecified 09/19/2015  . Moderna SARS-COVID-2 Vaccination 02/11/2019, 03/12/2019  . Tdap 04/09/2017      ICD-10-CM   1. Adult general medical exam  Z00.00 CBC with Differential/Platelet    COMPLETE METABOLIC PANEL WITH GFR    Lipid panel    TSH  2. Need for influenza vaccination  Z23 Flu Vaccine QUAD 6+ mos PF IM (Fluarix Quad PF)  3. Screening for malignant neoplasm of colon  Z12.11 Cologuard  4. Family history of thyroid dysfunction  Z83.49 TSH  5. Lipid screening  W09.811 COMPLETE METABOLIC PANEL WITH GFR    Lipid panel  6. Family history of hyperlipidemia  S23.953 COMPLETE METABOLIC PANEL WITH GFR    Lipid panel     Delsa Grana, PA-C 08/25/19 6:34 PM  [facility]

## 2019-08-25 NOTE — Patient Instructions (Signed)

## 2019-08-26 LAB — CBC WITH DIFFERENTIAL/PLATELET
Absolute Monocytes: 634 cells/uL (ref 200–950)
Basophils Absolute: 70 cells/uL (ref 0–200)
Basophils Relative: 1.1 %
Eosinophils Absolute: 192 cells/uL (ref 15–500)
Eosinophils Relative: 3 %
HCT: 38.6 % (ref 35.0–45.0)
Hemoglobin: 13.3 g/dL (ref 11.7–15.5)
Lymphs Abs: 2330 cells/uL (ref 850–3900)
MCH: 34.3 pg — ABNORMAL HIGH (ref 27.0–33.0)
MCHC: 34.5 g/dL (ref 32.0–36.0)
MCV: 99.5 fL (ref 80.0–100.0)
MPV: 11 fL (ref 7.5–12.5)
Monocytes Relative: 9.9 %
Neutro Abs: 3174 cells/uL (ref 1500–7800)
Neutrophils Relative %: 49.6 %
Platelets: 286 10*3/uL (ref 140–400)
RBC: 3.88 10*6/uL (ref 3.80–5.10)
RDW: 11.9 % (ref 11.0–15.0)
Total Lymphocyte: 36.4 %
WBC: 6.4 10*3/uL (ref 3.8–10.8)

## 2019-08-26 LAB — TSH: TSH: 2.05 mIU/L

## 2019-08-26 LAB — COMPLETE METABOLIC PANEL WITH GFR
AG Ratio: 1.7 (calc) (ref 1.0–2.5)
ALT: 11 U/L (ref 6–29)
AST: 23 U/L (ref 10–35)
Albumin: 4.3 g/dL (ref 3.6–5.1)
Alkaline phosphatase (APISO): 45 U/L (ref 31–125)
BUN: 16 mg/dL (ref 7–25)
CO2: 26 mmol/L (ref 20–32)
Calcium: 9.4 mg/dL (ref 8.6–10.2)
Chloride: 100 mmol/L (ref 98–110)
Creat: 0.6 mg/dL (ref 0.50–1.10)
GFR, Est African American: 127 mL/min/{1.73_m2} (ref >=60)
GFR, Est Non African American: 109 mL/min/{1.73_m2} (ref >=60)
Globulin: 2.6 g/dL (calc) (ref 1.9–3.7)
Glucose, Bld: 90 mg/dL (ref 65–99)
Potassium: 4.3 mmol/L (ref 3.5–5.3)
Sodium: 136 mmol/L (ref 135–146)
Total Bilirubin: 0.4 mg/dL (ref 0.2–1.2)
Total Protein: 6.9 g/dL (ref 6.1–8.1)

## 2019-08-26 LAB — LIPID PANEL
Cholesterol: 184 mg/dL (ref <200)
HDL: 99 mg/dL (ref >=50)
LDL Cholesterol (Calc): 69 mg/dL (calc)
Non-HDL Cholesterol (Calc): 85 mg/dL (calc) (ref <130)
Total CHOL/HDL Ratio: 1.9 (calc) (ref <5.0)
Triglycerides: 76 mg/dL (ref <150)

## 2019-09-14 ENCOUNTER — Encounter: Payer: Self-pay | Admitting: Family Medicine

## 2019-10-14 LAB — COLOGUARD: Cologuard: NEGATIVE

## 2020-01-19 ENCOUNTER — Other Ambulatory Visit: Payer: Self-pay

## 2020-01-19 ENCOUNTER — Ambulatory Visit: Payer: BC Managed Care – PPO | Admitting: Family Medicine

## 2020-01-19 ENCOUNTER — Encounter: Payer: Self-pay | Admitting: Family Medicine

## 2020-01-19 VITALS — BP 112/64 | HR 98 | Temp 98.4°F | Resp 14 | Ht 62.0 in | Wt 141.9 lb

## 2020-01-19 DIAGNOSIS — B379 Candidiasis, unspecified: Secondary | ICD-10-CM | POA: Diagnosis not present

## 2020-01-19 DIAGNOSIS — T3695XA Adverse effect of unspecified systemic antibiotic, initial encounter: Secondary | ICD-10-CM

## 2020-01-19 DIAGNOSIS — Z87440 Personal history of urinary (tract) infections: Secondary | ICD-10-CM

## 2020-01-19 DIAGNOSIS — F4321 Adjustment disorder with depressed mood: Secondary | ICD-10-CM | POA: Diagnosis not present

## 2020-01-19 DIAGNOSIS — R300 Dysuria: Secondary | ICD-10-CM

## 2020-01-19 DIAGNOSIS — Z634 Disappearance and death of family member: Secondary | ICD-10-CM

## 2020-01-19 LAB — POCT URINALYSIS DIPSTICK
Bilirubin, UA: NEGATIVE
Blood, UA: NEGATIVE
Glucose, UA: NEGATIVE
Ketones, UA: NEGATIVE
Leukocytes, UA: NEGATIVE
Nitrite, UA: NEGATIVE
Odor: NORMAL
Protein, UA: NEGATIVE
Spec Grav, UA: 1.02 (ref 1.010–1.025)
Urobilinogen, UA: 0.2 E.U./dL
pH, UA: 6 (ref 5.0–8.0)

## 2020-01-19 MED ORDER — BUSPIRONE HCL 5 MG PO TABS: 5.0000 mg | ORAL_TABLET | Freq: Two times a day (BID) | ORAL | 2 refills | Status: DC | PRN

## 2020-01-19 MED ORDER — FLUCONAZOLE 150 MG PO TABS: 150.0000 mg | ORAL_TABLET | ORAL | 1 refills | Status: DC | PRN

## 2020-01-19 MED ORDER — LORAZEPAM 0.5 MG PO TABS: 0.5000 mg | ORAL_TABLET | Freq: Two times a day (BID) | ORAL | 1 refills | Status: DC | PRN

## 2020-01-19 MED ORDER — CEPHALEXIN 500 MG PO CAPS: 500.0000 mg | ORAL_CAPSULE | Freq: Four times a day (QID) | ORAL | 0 refills | Status: AC

## 2020-01-19 NOTE — Progress Notes (Signed)
Patient ID: Kaitlyn Harvey, female    DOB: 1975/08/24, 46 y.o.   MRN: [id number]  PCP: Delsa Grana, PA-C  Chief Complaint  Patient presents with  . Urinary Tract Infection    Low abdominal pressure, no other symptoms    Subjective:   Kaitlyn Harvey is a 45 y.o. female, presents to clinic with CC of the following:  HPI  Dysuria Hx of UTI about 2 years ago, micro positive for e. Coli She presents with some intermittent low abd pressure on and off for a few days, may also be a little constipated.  No hematuria, no vaginal sx, denies N, V, flank pain, fever chills, sweats, frequency, or urgency  Results for orders placed or performed in visit on 01/19/20  POCT urinalysis dipstick  Result Value Ref Range   Color, UA light yellow    Clarity, UA clear    Glucose, UA Negative Negative   Bilirubin, UA neg    Ketones, UA neg    Spec Grav, UA 1.020 1.010 - 1.025   Blood, UA neg    pH, UA 6.0 5.0 - 8.0   Protein, UA Negative Negative   Urobilinogen, UA 0.2 0.2 or 1.0 E.U./dL   Nitrite, UA neg    Leukocytes, UA Negative Negative   Appearance clear    Odor normal    Recent death of her teenage son - pt is tearful today, good support from friends, family, husband   Patient Active Problem List   Diagnosis Date Noted  . Elevated blood-pressure reading without diagnosis of hypertension 03/02/2019  . Lumbar facet arthropathy 02/25/2018  . Lumbar spondylosis 02/25/2018  . Chronic pain syndrome 02/25/2018  . Compression fracture of T9 vertebra ([place]) 12/24/2017  . Murmur 05/09/2016  . Palpitations 05/09/2016  . Lumbar degenerative disc disease 08/09/2015  . Facet syndrome, lumbar 08/09/2015  . 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 Tobacco abuse 02/28/2015      Current Outpatient Medications:  .  acetaminophen (TYLENOL) 500 MG tablet, Take 500 mg by mouth every 6 (six) hours as needed., Disp: , Rfl:  .  Biotin 10 MG CAPS, Take 1 capsule by mouth daily., Disp: , Rfl:  .   calcium-vitamin D (OSCAL WITH D) 500-200 MG-UNIT tablet, Take 1 tablet by mouth., Disp: , Rfl:  .  ibuprofen (ADVIL,MOTRIN) 600 MG tablet, Take 1 tablet (600 mg total) by mouth every 6 (six) hours as needed. Take with food; stop if abd pain or blood in stool, Disp: 40 tablet, Rfl: 0 .  Multiple Vitamin (MULTIVITAMIN) tablet, Take 1 tablet by mouth daily., Disp: , Rfl:    No Known Allergies   Social History   Tobacco Use  . Smoking status: Former Smoker    Packs/day: 0.50    Years: 20.00    Pack years: 10.00    Types: Cigarettes    Start date: 12/20/1994    Quit date: 06/09/2015    Years since quitting: 4.6  . Smokeless tobacco: Never Used  Vaping Use  . Vaping Use: Never used  Substance Use Topics  . Alcohol use: Yes    Alcohol/week: 2.0 standard drinks    Types: 2 Glasses of wine per week  . Drug use: No      Chart Review Today: I personally reviewed active problem list, medication list, allergies, family history, social history, health maintenance, notes from last encounter, lab results, imaging with the patient/caregiver today.   Review of Systems  Constitutional: Negative.   HENT: Negative.  Eyes: Negative.   Respiratory: Negative.   Cardiovascular: Negative.   Gastrointestinal: Negative.   Endocrine: Negative.   Genitourinary: Negative.   Musculoskeletal: Negative.   Skin: Negative.   Allergic/Immunologic: Negative.   Neurological: Negative.   Hematological: Negative.   Psychiatric/Behavioral: Negative.   All other systems reviewed and are negative.      Objective:   Vitals:   01/19/20 1513  BP: 112/64  Pulse: 98  Resp: 14  Temp: 98.4 F (36.9 C)  SpO2: 98%  Weight: 141 lb 14.4 oz (64.4 kg)  Height: 5' 2"  (1.575 m)    Body mass index is 25.95 kg/m.  Physical Exam Vitals and nursing note reviewed.  Constitutional:      General: She is not in acute distress.    Appearance: Normal appearance. She is well-developed. She is not ill-appearing,  toxic-appearing or diaphoretic.     Interventions: Face mask in place.  HENT:     Head: Normocephalic and atraumatic.     Right Ear: External ear normal.     Left Ear: External ear normal.  Eyes:     General: Lids are normal. No scleral icterus.       Right eye: No discharge.        Left eye: No discharge.     Conjunctiva/sclera: Conjunctivae normal.  Neck:     Trachea: Phonation normal. No tracheal deviation.  Cardiovascular:     Rate and Rhythm: Normal rate and regular rhythm.     Pulses: Normal pulses.          Radial pulses are 2+ on the right side and 2+ on the left side.       Posterior tibial pulses are 2+ on the right side and 2+ on the left side.     Heart sounds: Normal heart sounds. No murmur heard. No friction rub. No gallop.   Pulmonary:     Effort: Pulmonary effort is normal. No respiratory distress.     Breath sounds: Normal breath sounds. No stridor. No wheezing, rhonchi or rales.  Chest:     Chest wall: No tenderness.  Abdominal:     General: Abdomen is flat. Bowel sounds are normal. There is no distension.     Palpations: Abdomen is soft. There is no mass or pulsatile mass.     Tenderness: There is no abdominal tenderness. There is no right CVA tenderness, left CVA tenderness, guarding or rebound. Negative signs include Murphy's sign.  Musculoskeletal:     Right lower leg: No edema.     Left lower leg: No edema.  Skin:    General: Skin is warm and dry.     Coloration: Skin is not jaundiced or pale.     Findings: No rash.  Neurological:     Mental Status: She is alert.     Motor: No abnormal muscle tone.     Gait: Gait normal.  Psychiatric:        Attention and Perception: Attention normal.        Mood and Affect: Mood is depressed. Affect is tearful.        Speech: Speech normal.        Behavior: Behavior normal. Behavior is cooperative.      Results for orders placed or performed in visit on 01/19/20  POCT urinalysis dipstick  Result Value Ref  Range   Color, UA light yellow    Clarity, UA clear    Glucose, UA Negative Negative   Bilirubin, UA neg  Ketones, UA neg    Spec Grav, UA 1.020 1.010 - 1.025   Blood, UA neg    pH, UA 6.0 5.0 - 8.0   Protein, UA Negative Negative   Urobilinogen, UA 0.2 0.2 or 1.0 E.U./dL   Nitrite, UA neg    Leukocytes, UA Negative Negative   Appearance clear    Odor normal        Assessment & Plan:     ICD-10-CM   1. Dysuria  R30.0 POCT urinalysis dipstick    Urine Culture    cephALEXin (KEFLEX) 500 MG capsule    fluconazole (DIFLUCAN) 150 MG tablet   UA unremarkable, mild sx, pt will wait for culture and hold abx for now  2. History of UTI  Z87.440 Urine Culture  3. Antibiotic-induced yeast infection  B37.9 fluconazole (DIFLUCAN) 150 MG tablet   T36.95XA    if needed diflucan sent in  4. Grief at loss of child  F43.21 LORazepam (ATIVAN) 0.5 MG tablet   Z63.4    pt tearful, expresses having screaming and crying fits, some trouble sleeping, supportive listening, gave pt resources, ativan prn for panic attacks/severe grie   Encouraged pt to think about grief counseling Discussed SSRI meds if she feels like she begins to be more constantly depressed or not herself Encouraged to let me know if she needs anything     Delsa Grana, PA-C 01/19/20 3:41 PM

## 2020-01-19 NOTE — Patient Instructions (Signed)
Grief Support through [facility], otherwise known as hospice and palliative care of [place] is an excellent resource -  [phone]  Check with Peds oncology if they have any support groups  Try buspar as needed and ativan as needed and let me know if you need anything more daily or long term

## 2020-01-20 LAB — URINE CULTURE
MICRO NUMBER:: 11429709
Result:: NO GROWTH
SPECIMEN QUALITY:: ADEQUATE

## 2020-03-02 ENCOUNTER — Telehealth: Payer: Self-pay | Admitting: Family Medicine

## 2020-03-02 NOTE — Telephone Encounter (Signed)
Pt calling stating that she contacted the pharmacy to have her Bupropion refilled, but they will not refill due to insurance until Saturday. She states that she is having a really hard time and cannot stop crying and is requesting to see if something can be sent over for her to hold her until Saturday. Please advise.      [id number]  [address] Phone: [phone]: [phone] Hours: Not open 24 hours

## 2020-03-02 NOTE — Telephone Encounter (Signed)
Spoke with patient and relayed message per Dr. Ancil Boozer. Patient verbalized understanding and expressed she will reach out to [facility] if she feels she cannot make it until Saturday.

## 2020-03-08 ENCOUNTER — Ambulatory Visit: Payer: Self-pay | Admitting: Family

## 2020-03-18 ENCOUNTER — Ambulatory Visit: Payer: Self-pay | Admitting: Family

## 2020-03-23 ENCOUNTER — Other Ambulatory Visit: Payer: Self-pay

## 2020-03-23 ENCOUNTER — Encounter: Payer: Self-pay | Admitting: Family

## 2020-03-23 ENCOUNTER — Ambulatory Visit (INDEPENDENT_AMBULATORY_CARE_PROVIDER_SITE_OTHER): Payer: BC Managed Care – PPO | Admitting: Family

## 2020-03-23 VITALS — BP 120/76 | HR 65 | Ht 65.0 in | Wt 144.0 lb

## 2020-03-23 DIAGNOSIS — F4321 Adjustment disorder with depressed mood: Secondary | ICD-10-CM

## 2020-03-23 DIAGNOSIS — R030 Elevated blood-pressure reading, without diagnosis of hypertension: Secondary | ICD-10-CM

## 2020-03-23 DIAGNOSIS — I340 Nonrheumatic mitral (valve) insufficiency: Secondary | ICD-10-CM | POA: Diagnosis not present

## 2020-03-23 NOTE — Patient Instructions (Addendum)
Medication Instructions:  Continue your current medications.   *If you need a refill on your cardiac medications before your next appointment, please call your pharmacy*   Lab Work: None ordered today.   Testing/Procedures: Your EKG today shows normal sinus rhythm.   Follow-Up: At [facility], you and your health needs are our priority.  As part of our continuing mission to provide you with exceptional heart care, we have created designated Provider Care Teams.  These Care Teams include your primary Cardiologist (physician) and Advanced Practice Providers (APPs -  Physician Assistants and Nurse Practitioners) who all work together to provide you with the care you need, when you need it.  We recommend signing up for the patient portal called "MyChart".  Sign up information is provided on this After Visit Summary.  MyChart is used to connect with patients for Virtual Visits (Telemedicine).  Patients are able to view lab/test results, encounter notes, upcoming appointments, etc.  Non-urgent messages can be sent to your provider as well.   To learn more about what you can do with MyChart, go to [email or web address].    Your next appointment:   1 year(s)  The format for your next appointment:   In Person  Provider:   You may see Nelva Bush, MD or one of the following Advanced Practice Providers on your designated Care Team:    Murray Hodgkins, NP  Christell Faith, PA-C  Marrianne Mood, PA-C  Cadence Kathlen Mody, [place]  Laurann [place], NP  Other Instructions  Heart Healthy Diet Recommendations: A low-salt diet is recommended. Meats should be grilled, baked, or boiled. Avoid fried foods. Focus on lean protein sources like fish or chicken with vegetables and fruits. The American Heart Association is a Microbiologist!  American Heart Association Diet and Lifeystyle Recommendations   Exercise recommendations: The American Heart Association recommends 150 minutes of moderate  intensity exercise weekly. Try 30 minutes of moderate intensity exercise 4-5 times per week. This could include walking, jogging, or swimming.

## 2020-03-23 NOTE — Progress Notes (Signed)
Office Visit    Patient Name: Kaitlyn Harvey Date of Encounter: 03/23/2020  PCP:  Delsa Grana, [place]  Cardiologist:  Nelva Bush, MD  Advanced Practice Provider:  No care team member to display Electrophysiologist:  None   Chief Complaint    Kaitlyn Harvey is a 45 y.o. female with a hx of nec pain secondary to arthritis, mild mitral regurgitation with murmur presents today for annual follow up of MR.   Past Medical History    Past Medical History:  Diagnosis Date  . Arthritis    chronic back pain  . Thoracic compression fracture, sequela 12/24/2017   March 31, 2013   Past Surgical History:  Procedure Laterality Date  . UPPER GASTROINTESTINAL ENDOSCOPY  2008  . UPPER GI ENDOSCOPY      Allergies  No Known Allergies  History of Present Illness    Kaitlyn Harvey is a 45 y.o. female with a hx of neck pain secondary to arthritis, mitral regurgitation with murmur, palpitations last seen 03/02/19 by Dr. Saunders Revel.  Echo 07/2016 with mild MR with known murmur dating back to childhood. She was seen in follow 03/02/19 with no cardiac symptoms and no indication for echocardiogram at that time.   Presents today for follow up. She shares with me that her 67 year old son was diagnosed in November with leukemia and passed away in 02-05-2022. Her husband is a good support system and she is also seeing a counselor and using PRN Buspar provided by her primary care provider. Offered my condolences. 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 Notices her BP is much better since she has had less stress with her new teaching role.   Reports no shortness of breath nor dyspnea on exertion. Reports no chest pain, pressure, or tightness. No edema, orthopnea, PND. Reports no palpitations.   EKGs/Labs/Other Studies Reviewed:   The following studies were reviewed today:  Echo 07/2016 Left ventricle: The cavity size was normal. Systolic  function was    normal. The estimated ejection fraction was in the range of 60%    to 65%. Wall motion was normal; there were no regional wall    motion abnormalities. Left ventricular diastolic function    parameters were normal.  - Mitral valve: There was mild regurgitation.  - Left atrium: The atrium was normal in size.  - Right ventricle: Systolic function was normal.  - Pulmonary arteries: Systolic pressure was within the normal    range.   EKG:  EKG is  ordered today.  The ekg ordered today demonstrates SR 65 bpm with no acute ST/T wave changes.   Recent Labs: 08/25/2019: ALT 11; BUN 16; Creat 0.60; Hemoglobin 13.3; Platelets 286; Potassium 4.3; Sodium 136; TSH 2.05  Recent Lipid Panel    Component Value Date/Time   CHOL 184 08/25/2019 1536   CHOL 169 03/08/2015 0736   TRIG 76 08/25/2019 1536   HDL 99 08/25/2019 1536   HDL 112 03/08/2015 0736   CHOLHDL 1.9 08/25/2019 1536   VLDL 8 03/05/2016 0814   LDLCALC 69 08/25/2019 1536    Home Medications   Current Meds  Medication Sig  . acetaminophen (TYLENOL) 500 MG tablet Take 500 mg by mouth every 6 (six) hours as needed.  . Biotin 10 MG CAPS Take 1 capsule by mouth daily.  . busPIRone (BUSPAR) 5 MG tablet Take 1-2 tablets (5-10 mg total) by mouth 2 (two) times daily as  needed.  . calcium-vitamin D (OSCAL WITH D) 500-200 MG-UNIT tablet Take 1 tablet by mouth.  . cloNIDine (CATAPRES) 0.1 MG tablet Take 0.1 mg by mouth at bedtime.  . fluconazole (DIFLUCAN) 150 MG tablet Take 1 tablet (150 mg total) by mouth every 3 (three) days as needed (for vaginal itching/yeast infection sx).  Marland Kitchen ibuprofen (ADVIL,MOTRIN) 600 MG tablet Take 1 tablet (600 mg total) by mouth every 6 (six) hours as needed. Take with food; stop if abd pain or blood in stool  . LORazepam (ATIVAN) 0.5 MG tablet Take 1 tablet (0.5 mg total) by mouth 2 (two) times daily as needed for anxiety.  . Multiple Vitamin (MULTIVITAMIN) tablet Take 1 tablet by mouth daily.      Review of Systems  All other systems reviewed and are otherwise negative except as noted above.  Physical Exam    VS:  BP 120/76 (BP Location: Left Arm, Patient Position: Sitting, Cuff Size: Normal)   Pulse 65   Ht 5' 5"  (1.651 m)   Wt 144 lb (65.3 kg)   SpO2 99%   BMI 23.96 kg/m  , BMI Body mass index is 23.96 kg/m.  Wt Readings from Last 3 Encounters:  03/23/20 144 lb (65.3 kg)  01/19/20 141 lb 14.4 oz (64.4 kg)  08/25/19 143 lb 9.6 oz (65.1 kg)    GEN: Well nourished, well developed, in no acute distress. HEENT: normal. Neck: Supple, no JVD, carotid bruits, or masses. Cardiac: RRR, no murmurs, rubs, or gallops. No clubbing, cyanosis, edema.  Radials/DP/PT 2+ and equal bilaterally.  Respiratory: Respirations regular and unlabored, clear to auscultation bilaterally. GI: Soft, nontender, nondistended. MS: No deformity or atrophy. Skin: Warm and dry, no rash. Neuro:  Strength and sensation are intact. Psych: Normal affect.  Assessment & Plan    1. Mitral regurgitation - Mild by echo 2018. No new dyspnea, lightheadedness. No murmur on exam. No indication for repeat echocardiogram at this time.   2. Elevated blood pressure - Resolved with BP routinely <130/80. No indication for antihypertensive therapy.  3. Grief - Offered condolences for loss of her 58 year old son in January to leukemia. Encouraged to continue participating in counseling and following with her primary care provider. Notes good response to Buspar.   Disposition: Follow up in 1 year(s) with Dr. Saunders Revel or APP.   Signed, Loel Dubonnet, NP 03/23/2020, 2:48 PM [place]

## 2020-03-29 ENCOUNTER — Other Ambulatory Visit: Payer: Self-pay | Admitting: Family Medicine

## 2020-03-29 NOTE — Telephone Encounter (Signed)
Spoke with patient and she actually scheduled a visit for 04/01/20.  Patient also informed me that she did receive a text message stating that the buspar medication refill was sent into her pharmacy on today.

## 2020-03-29 NOTE — Telephone Encounter (Signed)
Called left VM

## 2020-04-01 ENCOUNTER — Other Ambulatory Visit: Payer: Self-pay

## 2020-04-01 ENCOUNTER — Encounter: Payer: Self-pay | Admitting: Family Medicine

## 2020-04-01 ENCOUNTER — Telehealth (INDEPENDENT_AMBULATORY_CARE_PROVIDER_SITE_OTHER): Payer: BC Managed Care – PPO | Admitting: Family Medicine

## 2020-04-01 DIAGNOSIS — Z634 Disappearance and death of family member: Secondary | ICD-10-CM | POA: Diagnosis not present

## 2020-04-01 DIAGNOSIS — F4321 Adjustment disorder with depressed mood: Secondary | ICD-10-CM | POA: Diagnosis not present

## 2020-04-01 DIAGNOSIS — F410 Panic disorder [episodic paroxysmal anxiety] without agoraphobia: Secondary | ICD-10-CM | POA: Diagnosis not present

## 2020-04-01 NOTE — Progress Notes (Signed)
Name: Kaitlyn Harvey   MRN: [id number]    DOB: November 19, 1975   Date:04/01/2020       Progress Note  Subjective:    Chief Complaint  Chief Complaint  Patient presents with  . Medication Refill    I connected with  Kaitlyn Harvey on 04/01/20 at  4:00 PM EDT by telephone and verified that I am speaking with the correct person using two identifiers.   I discussed the limitations, risks, security and privacy concerns of performing an evaluation and management service by telephone and the availability of in person appointments. Staff also discussed with the patient that there may be a patient responsible charge related to this service.  Patient verbalized understanding and agreed to proceed with encounter. Patient Location:  home Provider Location:  [facility] clinic Additional Individuals present:    HPI Still dealing with grief and loss - therapist weekly - Dr. Rickey Primus -restorative counseling    She was taking buspar 5-10 mg up to TID about two months ago after OV, has been able to decrease dose and most days she is taking 5 mg TID.  She was using 0.5 mg lorazepam BID got #60 1/18 and 2/17 and she still has #30 left, not using daily, in the last week maybe used once Less panic attacks  Depression screen [facility] 2/9 04/01/2020 01/19/2020 08/25/2019  Decreased Interest 0 0 0  Down, Depressed, Hopeless 0 0 0  PHQ - 2 Score 0 0 0  Altered sleeping 1 0 0  Tired, decreased energy 0 0 0  Change in appetite 0 0 0  Feeling bad or failure about yourself  0 0 0  Trouble concentrating 0 0 0  Moving slowly or fidgety/restless 0 0 0  Suicidal thoughts 0 0 0  PHQ-9 Score 1 0 0  Difficult doing work/chores Not difficult at all Not difficult at all Not difficult at all  Some recent data might be hidden   GAD 7 : Generalized Anxiety Score 04/01/2020  Nervous, Anxious, on Edge 0  Control/stop worrying 0  Worry too much - different things 0  Trouble relaxing 0  Restless 0  Easily annoyed or irritable 0  Afraid  - awful might happen 0  Total GAD 7 Score 0  Anxiety Difficulty Not difficult at all   Lorazepam makes her sleepy - buspar still works well and "takes the edge off" She is seeing friends, traveling with husband, going walking and trying to stay busy.    Patient Active Problem List   Diagnosis Date Noted  . Elevated blood-pressure reading without diagnosis of hypertension 03/02/2019  . Lumbar facet arthropathy 02/25/2018  . Lumbar spondylosis 02/25/2018  . Chronic pain syndrome 02/25/2018  . Compression fracture of T9 vertebra ([place]) 12/24/2017  . Murmur 05/09/2016  . Palpitations 05/09/2016  . Lumbar degenerative disc disease 08/09/2015  . Facet syndrome, lumbar 08/09/2015  . 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 Tobacco abuse 02/28/2015    Social History   Tobacco Use  . Smoking status: Former Smoker    Packs/day: 0.50    Years: 20.00    Pack years: 10.00    Types: Cigarettes    Start date: 12/20/1994    Quit date: 06/09/2015    Years since quitting: 4.8  . Smokeless tobacco: Never Used  Substance Use Topics  . Alcohol use: Yes    Alcohol/week: 2.0 standard drinks    Types: 2 Glasses of wine per week     Current Outpatient Medications:  .  acetaminophen (TYLENOL) 500 MG tablet, Take 500 mg by mouth every 6 (six) hours as needed., Disp: , Rfl:  .  Biotin 10 MG CAPS, Take 1 capsule by mouth daily., Disp: , Rfl:  .  busPIRone (BUSPAR) 5 MG tablet, TAKE 1-2 TABLETS (5-10 MG TOTAL) BY MOUTH 2 (TWO) TIMES DAILY AS NEEDED., Disp: 90 tablet, Rfl: 2 .  calcium-vitamin D (OSCAL WITH D) 500-200 MG-UNIT tablet, Take 1 tablet by mouth., Disp: , Rfl:  .  ibuprofen (ADVIL,MOTRIN) 600 MG tablet, Take 1 tablet (600 mg total) by mouth every 6 (six) hours as needed. Take with food; stop if abd pain or blood in stool, Disp: 40 tablet, Rfl: 0 .  LORazepam (ATIVAN) 0.5 MG tablet, Take 1 tablet (0.5 mg total) by mouth 2 (two) times daily as needed for anxiety., Disp: 60 tablet, Rfl: 1 .   Multiple Vitamin (MULTIVITAMIN) tablet, Take 1 tablet by mouth daily., Disp: , Rfl:  .  cloNIDine (CATAPRES) 0.1 MG tablet, Take 0.1 mg by mouth at bedtime. (Patient not taking: Reported on 04/01/2020), Disp: , Rfl:  .  fluconazole (DIFLUCAN) 150 MG tablet, Take 1 tablet (150 mg total) by mouth every 3 (three) days as needed (for vaginal itching/yeast infection sx). (Patient not taking: Reported on 04/01/2020), Disp: 2 tablet, Rfl: 1  No Known Allergies  Chart Review: I personally reviewed active problem list, medication list, allergies, family history, social history, health maintenance, notes from last encounter, lab results, imaging with the patient/caregiver today.   Review of Systems 10 Systems reviewed and are negative for acute change except as noted in the HPI.   Objective:    Virtual encounter, vitals limited, only able to obtain the following There were no vitals filed for this visit. There is no height or weight on file to calculate BMI. Nursing Note and Vital Signs reviewed.  Physical Exam Phonation clear, alert, mood good, tearful through some of encounter PE limited by telephone encounter  No results found for this or any previous visit (from the past 72 hour(s)).  Assessment and Plan:     ICD-10-CM   1. Grief at loss of child  F43.21    Z63.4    symptoms present but most days less severe and less frequent, continue buspar and therapy   2. Panic attacks  F41.0    continue buspar, therapy, self help/coping skills and try to use lorazepam less than 10 doses  in a month, >10 needs SSRI or SNRI or psych  controlled substance database reviewed Pt has #30 lorazepam remaining, should last about 3 months - explained needed f/up OV if she needs more or requests refills sooner  Not currently using daily - doubt any risk for withdrawal, discussed S/E risks dependency etc today  I provided 20+ minutes of non-face-to-face time during this encounter.  Delsa Grana,  PA-C 04/01/20 4:18 PM

## 2020-06-03 ENCOUNTER — Other Ambulatory Visit: Payer: Self-pay | Admitting: Family Medicine

## 2020-08-25 ENCOUNTER — Other Ambulatory Visit: Payer: Self-pay | Admitting: Family Medicine

## 2020-08-25 DIAGNOSIS — Z1231 Encounter for screening mammogram for malignant neoplasm of breast: Secondary | ICD-10-CM

## 2020-08-31 ENCOUNTER — Other Ambulatory Visit: Payer: Self-pay | Admitting: Family Medicine

## 2020-09-08 ENCOUNTER — Other Ambulatory Visit: Payer: Self-pay

## 2020-09-08 ENCOUNTER — Ambulatory Visit
Admission: RE | Admit: 2020-09-08 | Discharge: 2020-09-08 | Disposition: A | Discharge status: Home / Self Care | Payer: BC Managed Care – PPO | Source: Ambulatory Visit | Attending: Family Medicine | Admitting: Family Medicine

## 2020-09-08 DIAGNOSIS — Z1231 Encounter for screening mammogram for malignant neoplasm of breast: Secondary | ICD-10-CM | POA: Insufficient documentation

## 2020-11-09 ENCOUNTER — Telehealth (INDEPENDENT_AMBULATORY_CARE_PROVIDER_SITE_OTHER): Payer: BC Managed Care – PPO | Admitting: Family Medicine

## 2020-11-09 ENCOUNTER — Telehealth: Payer: Self-pay | Admitting: Family Medicine

## 2020-11-09 ENCOUNTER — Ambulatory Visit: Payer: Self-pay | Admitting: *Deleted

## 2020-11-09 ENCOUNTER — Encounter: Payer: Self-pay | Admitting: Family Medicine

## 2020-11-09 VITALS — Temp 98.1°F | Ht 65.0 in | Wt 144.0 lb

## 2020-11-09 DIAGNOSIS — J069 Acute upper respiratory infection, unspecified: Secondary | ICD-10-CM | POA: Diagnosis not present

## 2020-11-09 MED ORDER — BENZONATATE 200 MG PO CAPS
200.0000 mg | ORAL_CAPSULE | Freq: Two times a day (BID) | ORAL | 0 refills | Status: DC | PRN
Start: 2020-11-09 — End: 2020-11-09

## 2020-11-09 MED ORDER — HYDROCODONE BIT-HOMATROP MBR 5-1.5 MG/5ML PO SOLN
5.0000 mL | Freq: Three times a day (TID) | ORAL | 0 refills | Status: DC | PRN
Start: 2020-11-09 — End: 2021-03-24

## 2020-11-09 NOTE — Addendum Note (Signed)
Addended by: Myles Gip on: 11/09/2020 04:58 PM   Modules accepted: Orders

## 2020-11-09 NOTE — Telephone Encounter (Signed)
Patient called in with cough, says the Benzonatate, Dr Ky Barban sent isnt working, wants something stronger sent to , [id number] [place] Phone: [phone] Fax: [phone] Reason for Disposition  Prescription request for new medicine (not a refill)  Answer Assessment - Initial Assessment Questions 1. NAME of MEDICATION: "What medicine are you calling about?"     Benzonatate pills 2. QUESTION: "What is your question?" (e.g., double dose of medicine, side effect)     Patient states she does not respond well to pills- she would prefer a prescription cough syrup. 3. PRESCRIBING HCP: "Who prescribed it?" Reason: if prescribed by specialist, call should be referred to that group.     [place] 4. SYMPTOMS: "Do you have any symptoms?"     Cough- unable to sleep at night 5. SEVERITY: If symptoms are present, ask "Are they mild, moderate or severe?"     Patient states she is coughing all of the time- was seen today- requesting stronger cough medication- syrup. 6. PREGNANCY:  "Is there any chance that you are pregnant?" "When was your last menstrual period?"     na  Protocols used: Medication Question Call-A-AH

## 2020-11-09 NOTE — Telephone Encounter (Signed)
Patient had appointment with provider today- she was given oral pills for cough. Patient is calling to request Rx for cough syrup- she states the pills do not help her- she has had them before.

## 2020-11-09 NOTE — Patient Instructions (Signed)
You have a cold and it should start to get better about 7 - 10 days after it started.    For your cough, try the cough medicine.  Some other therapies you can try are: mucinex, push fluids, rest, and return office visit as needed if symptoms persist or worsen.   Drinking warm liquids such as teas and soups can help with secretions and cough. A mist humidifier or vaporizer can work well to help with secretions and cough.  It is very important to clean the humidifier between use according to the instructions.    It was good to see you.  If you're still having trouble in the next week, come back and see [place].    Of course, if you start having trouble breathing, worsening fevers, vomiting and unable to hold down any fluids, or you have other concerns, don't hesitate to come back or go to the ED after hours.

## 2020-11-09 NOTE — Progress Notes (Signed)
Virtual Visit via Video Note  I connected with [name] on 11/09/20 at 11:40 AM EST by a video enabled telemedicine application and verified that I am speaking with the correct person using two identifiers.  Location: Patient: home Provider: [facility]   I discussed the limitations of evaluation and management by telemedicine and the availability of in person appointments. The patient expressed understanding and agreed to proceed.  History of Present Illness:  UPPER RESPIRATORY TRACT INFECTION - symptom onset 11/6 - COVID test negative at home - husband with similar symptoms - headache, body aches, congestion, fever. Improving.  Fever: yes, Tmax 100.7, now resolved Cough: yes, productive of phlegm Shortness of breath: no Wheezing: no Chest pain: no Chest tightness: no Chest congestion: a little Nasal congestion: no Headache: yes, improved Ear pain: no  Ear pressure: no  Vomiting: no Rash: no Sick contacts: yes Context: better Treatments attempted: tylenol, cough syrup   Observations/Objective:  Well appearing, in NAD. Speaks in full sentences, no respiratory distress.  Assessment and Plan:  VIRAL URI Doing well with mild sx, improving. COVID negative. Reviewed OTC symptom relief and emergency precautions. Rx tessalon. F/u prn.     I discussed the assessment and treatment plan with the patient. The patient was provided an opportunity to ask questions and all were answered. The patient agreed with the plan and demonstrated an understanding of the instructions.   The patient was advised to call back or seek an in-person evaluation if the symptoms worsen or if the condition fails to improve as anticipated.  I provided 6 minutes of non-face-to-face time during this encounter.   Myles Gip, DO

## 2020-11-10 NOTE — Telephone Encounter (Signed)
Left vm to notify patient

## 2021-03-21 NOTE — Patient Instructions (Signed)

## 2021-03-24 ENCOUNTER — Encounter: Payer: Self-pay | Admitting: Family Medicine

## 2021-03-24 ENCOUNTER — Ambulatory Visit (INDEPENDENT_AMBULATORY_CARE_PROVIDER_SITE_OTHER): Payer: BC Managed Care – PPO | Admitting: Family Medicine

## 2021-03-24 VITALS — BP 128/82 | HR 94 | Temp 97.3°F | Resp 16 | Ht 65.0 in | Wt 142.5 lb

## 2021-03-24 DIAGNOSIS — Z634 Disappearance and death of family member: Secondary | ICD-10-CM

## 2021-03-24 DIAGNOSIS — Z23 Encounter for immunization: Secondary | ICD-10-CM | POA: Diagnosis not present

## 2021-03-24 DIAGNOSIS — F4320 Adjustment disorder, unspecified: Secondary | ICD-10-CM

## 2021-03-24 DIAGNOSIS — Z1231 Encounter for screening mammogram for malignant neoplasm of breast: Secondary | ICD-10-CM

## 2021-03-24 DIAGNOSIS — I340 Nonrheumatic mitral (valve) insufficiency: Secondary | ICD-10-CM

## 2021-03-24 DIAGNOSIS — F4321 Adjustment disorder with depressed mood: Secondary | ICD-10-CM

## 2021-03-24 DIAGNOSIS — Z0000 Encounter for general adult medical examination without abnormal findings: Secondary | ICD-10-CM | POA: Diagnosis not present

## 2021-03-24 DIAGNOSIS — Z1211 Encounter for screening for malignant neoplasm of colon: Secondary | ICD-10-CM

## 2021-03-24 MED ORDER — LORAZEPAM 0.5 MG PO TABS: 0.5000 mg | ORAL_TABLET | Freq: Every day | ORAL | 0 refills | Status: DC | PRN

## 2021-03-24 MED ORDER — SERTRALINE HCL 50 MG PO TABS: 50.0000 mg | ORAL_TABLET | Freq: Every day | ORAL | 3 refills | Status: DC

## 2021-03-24 NOTE — Progress Notes (Signed)
? ? ?Patient: Kaitlyn Harvey, Female    DOB: 12-Apr-1975, 46 y.o.   MRN: [id number] ?Delsa Grana, PA-C ?Visit Date: 03/24/2021 ? ?Today's Provider: Delsa Grana, PA-C  ? ?Chief Complaint  ?Patient presents with  ? Annual Exam  ? ?Subjective:  ? ?Annual physical exam: ? ?Kaitlyn Harvey is a 46 y.o. female who presents today for complete physical exam: ? ?Exercise/Activity:  active ?Diet/nutrition:  overall healthy ?Sleep: sleeps well  ? ?SDOH Screenings  ? ?Alcohol Screen: Low Risk   ? Last Alcohol Screening Score (AUDIT): 2  ?Depression (PHQ2-9): Medium Risk  ? PHQ-2 Score: 5  ?Financial Resource Strain: Low Risk   ? Difficulty of Paying Living Expenses: Not hard at all  ?Food Insecurity: No Food Insecurity  ? Worried About Charity fundraiser in the Last Year: Never true  ? Ran Out of Food in the Last Year: Never true  ?Housing: Low Risk   ? Last Housing Risk Score: 0  ?Physical Activity: Sufficiently Active  ? Days of Exercise per Week: 5 days  ? Minutes of Exercise per Session: 30 min  ?Social Connections: Moderately Isolated  ? Frequency of Communication with Friends and Family: More than three times a week  ? Frequency of Social Gatherings with Friends and Family: More than three times a week  ? Attends Religious Services: Never  ? Active Member of Clubs or Organizations: No  ? Attends Archivist Meetings: Never  ? Marital Status: Married  ?Stress: Stress Concern Present  ? Feeling of Stress : Very much  ?Tobacco Use: Medium Risk  ? Smoking Tobacco Use: Former  ? Smokeless Tobacco Use: Never  ? Passive Exposure: Not on file  ?Transportation Needs: No Transportation Needs  ? Lack of Transportation (Medical): No  ? Lack of Transportation (Non-Medical): No  ? ? ? ?USPSTF grade A and B recommendations - reviewed and addressed today ? ?Depression:  ?Phq 9 completed today by patient, was reviewed by me with patient in the room ?PHQ score is positive, pt feels sad/grieving ? ?  03/24/2021  ?  1:21 PM 11/09/2020  ?  11:19 AM 04/01/2020  ?  3:43 PM 01/19/2020  ?  3:12 PM  ?PHQ 2/9 Scores  ?PHQ - 2 Score 2 0 0 0  ?PHQ- 9 Score 5 0 1 0  ? ? ?  03/24/2021  ?  1:21 PM 11/09/2020  ? 11:19 AM 04/01/2020  ?  3:43 PM 01/19/2020  ?  3:12 PM 08/25/2019  ?  2:55 PM  ?Depression screen PHQ 2/9  ?Decreased Interest 1 0 0 0 0  ?Down, Depressed, Hopeless 1 0 0 0 0  ?PHQ - 2 Score 2 0 0 0 0  ?Altered sleeping 1 0 1 0 0  ?Tired, decreased energy 1 0 0 0 0  ?Change in appetite 1 0 0 0 0  ?Feeling bad or failure about yourself  0 0 0 0 0  ?Trouble concentrating 0 0 0 0 0  ?Moving slowly or fidgety/restless 0 0 0 0 0  ?Suicidal thoughts 0 0 0 0 0  ?PHQ-9 Score 5 0 1 0 0  ?Difficult doing work/chores Somewhat difficult Not difficult at all Not difficult at all Not difficult at all Not difficult at all  ? ? ?Alcohol screening: ?[place] Office Visit from 03/24/2021 in [facility]  ?AUDIT-C Score 2  ? ?  ? ? ?Immunizations and Health Maintenance: ?Health Maintenance  ?Topic Date Due  ? COVID-19 Vaccine (3 -  Booster for Moderna series) 04/09/2021 (Originally 05/07/2019)  ? PAP SMEAR-Modifier  08/18/2021  ? MAMMOGRAM  09/08/2021  ? Fecal DNA (Cologuard)  10/07/2022  ? TETANUS/TDAP  04/10/2027  ? INFLUENZA VACCINE  Completed  ? Hepatitis C Screening  Completed  ? HIV Screening  Completed  ? HPV VACCINES  Aged Out  ?  ? ?Hep C Screening: done ? ?STD testing and prevention (HIV/chl/gon/syphilis):  see above, no additional testing desired by pt today - HIV ? ?Intimate partner violence:safe  ? ?Sexual History/Pain during Intercourse: Married ? ?Menstrual History/LMP/Abnormal Bleeding:  irregular ?Patient's last menstrual period was 01/17/2021 (approximate). ? ?Incontinence Symptoms: none ? ?Breast cancer: done sept  ?Last Mammogram: *see HM list above ?BRCA gene screening:  ? ?Cervical cancer screening: due later in the year - she would like to do later and not today ?Pt denies family hx of cancers - breast, ovarian, uterine, colon:     ? ?Osteoporosis:   ?Discussion on osteoporosis per age, including high calcium and vitamin D supplementation, weight bearing exercises ?Pt was previously supplementing with daily calcium/Vit D. ? ? ?Skin cancer:  Hx of skin CA -  NO ?Discussed atypical lesions  ? ?Colorectal cancer:   ?Colonoscopy is UTD - did cologuard previously due in 2024 ?Discussed concerning signs and sx of CRC, pt denies melena, hematochezia, change in bowels ? ?Lung cancer:   ?Low Dose CT Chest recommended if Age 74-80 years, 20 pack-year currently smoking OR have quit w/in 15years. Patient does not qualify.   ? ?Social History  ? ?Tobacco Use  ? Smoking status: Former  ?  Packs/day: 0.50  ?  Years: 20.00  ?  Pack years: 10.00  ?  Types: Cigarettes  ?  Start date: 12/20/1994  ?  Quit date: 06/09/2015  ?  Years since quitting: 5.7  ? Smokeless tobacco: Never  ?Vaping Use  ? Vaping Use: Never used  ?Substance Use Topics  ? Alcohol use: Yes  ?  Alcohol/week: 5.0 standard drinks  ?  Types: 5 Glasses of wine per week  ? Drug use: No  ?  ? ?[place] Office Visit from 03/24/2021 in [facility]  ?AUDIT-C Score 2  ? ?  ? ? ?Family History  ?Problem Relation Age of Onset  ? Asthma Mother   ? Hypertension Mother   ? Thyroid disease Mother   ? Other Mother   ?     dilatation of ascending aorta  ? Heart attack Father 38  ? Hypertension Brother   ? Bell's palsy Brother   ? Heart murmur Brother   ? Breast cancer Neg Hx   ?  ? ?Blood pressure/Hypertension: ?BP Readings from Last 3 Encounters:  ?03/24/21 128/82  ?03/23/20 120/76  ?01/19/20 112/64  ? ? ?Weight/Obesity: ?Wt Readings from Last 3 Encounters:  ?03/24/21 142 lb 8 oz (64.6 kg)  ?11/09/20 144 lb (65.3 kg)  ?03/23/20 144 lb (65.3 kg)  ? ?BMI Readings from Last 3 Encounters:  ?03/24/21 23.71 kg/m?  ?11/09/20 23.96 kg/m?  ?03/23/20 23.96 kg/m?  ?  ? ?Lipids:  ?Lab Results  ?Component Value Date  ? CHOL 184 08/25/2019  ? CHOL 178 04/09/2017  ? CHOL 201 (H) 03/05/2016  ? ?Lab  Results  ?Component Value Date  ? HDL 99 08/25/2019  ? HDL 105 04/09/2017  ? HDL 123 03/05/2016  ? ?Lab Results  ?Component Value Date  ? [place] 69 08/25/2019  ? [place] 54 04/09/2017  ? [place] 70 03/05/2016  ? ?  Lab Results  ?Component Value Date  ? TRIG 76 08/25/2019  ? TRIG 105 04/09/2017  ? TRIG 41 03/05/2016  ? ?Lab Results  ?Component Value Date  ? CHOLHDL 1.9 08/25/2019  ? CHOLHDL 1.7 04/09/2017  ? CHOLHDL 1.6 03/05/2016  ? ?No results found for: LDLDIRECT ?Based on the results of lipid panel his/her cardiovascular risk factor ( using [facility] )  in the next 10 years is: ?The 10-year ASCVD risk score (Arnett DK, et al., 2019) is: 0.4% ?  Values used to calculate the score: ?    Age: 22 years ?    Sex: Female ?    Is Non-Hispanic African American: No ?    Diabetic: No ?    Tobacco smoker: No ?    Systolic Blood Pressure: 347 mmHg ?    Is BP treated: No ?    HDL Cholesterol: 99 mg/dL ?    Total Cholesterol: 184 mg/dL ? ?Glucose:  ?Glucose, Bld  ?Date Value Ref Range Status  ?08/25/2019 90 65 - 99 mg/dL Final  ?  Comment:  ?  . ?           Fasting reference interval ?. ?  ?04/09/2017 83 65 - 139 mg/dL Final  ?  Comment:  ?  . ?       Non-fasting reference interval ?. ?  ?03/05/2016 83 65 - 99 mg/dL Final  ? ? ?Advanced Care Planning:  ?A voluntary discussion about advance care planning including the explanation and discussion of advance directives.   ?Discussed health care proxy and Living will, and the patient was able to identify a health care proxy as husband, Joneen Boers.   ?Patient does not have a living will at present time.  ? ?Social History ?      ?Social History  ? ?Socioeconomic History  ? Marital status: Married  ?  Spouse name: Joneen Boers  ? Number of children: 1  ? Years of education: Not on file  ? Highest education level: Not on file  ?Occupational History  ? Occupation: Pharmacist, hospital  ?Tobacco Use  ? Smoking status: Former  ?  Packs/day: 0.50  ?  Years: 20.00  ?  Pack years: 10.00  ?  Types: Cigarettes  ?   Start date: 12/20/1994  ?  Quit date: 06/09/2015  ?  Years since quitting: 5.7  ? Smokeless tobacco: Never  ?Vaping Use  ? Vaping Use: Never used  ?Substance and Sexual Activity  ? Alcohol use: Yes  ?  Alcohol/week: 5.0

## 2021-03-25 LAB — COMPLETE METABOLIC PANEL WITH GFR
AG Ratio: 1.8 (calc) (ref 1.0–2.5)
ALT: 38 U/L — ABNORMAL HIGH (ref 6–29)
AST: 60 U/L — ABNORMAL HIGH (ref 10–35)
Albumin: 4.5 g/dL (ref 3.6–5.1)
Alkaline phosphatase (APISO): 56 U/L (ref 31–125)
BUN: 9 mg/dL (ref 7–25)
CO2: 27 mmol/L (ref 20–32)
Calcium: 9.7 mg/dL (ref 8.6–10.2)
Chloride: 101 mmol/L (ref 98–110)
Creat: 0.56 mg/dL (ref 0.50–0.99)
Globulin: 2.5 g/dL (calc) (ref 1.9–3.7)
Glucose, Bld: 80 mg/dL (ref 65–99)
Potassium: 4.8 mmol/L (ref 3.5–5.3)
Sodium: 139 mmol/L (ref 135–146)
Total Bilirubin: 0.4 mg/dL (ref 0.2–1.2)
Total Protein: 7 g/dL (ref 6.1–8.1)
eGFR: 113 mL/min/{1.73_m2} (ref >=60)

## 2021-03-25 LAB — CBC WITH DIFFERENTIAL/PLATELET
Absolute Monocytes: 655 cells/uL (ref 200–950)
Basophils Absolute: 70 cells/uL (ref 0–200)
Basophils Relative: 1.4 %
Eosinophils Absolute: 140 cells/uL (ref 15–500)
Eosinophils Relative: 2.8 %
HCT: 40.4 % (ref 35.0–45.0)
Hemoglobin: 13.7 g/dL (ref 11.7–15.5)
Lymphs Abs: 1820 cells/uL (ref 850–3900)
MCH: 33.8 pg — ABNORMAL HIGH (ref 27.0–33.0)
MCHC: 33.9 g/dL (ref 32.0–36.0)
MCV: 99.8 fL (ref 80.0–100.0)
MPV: 11.6 fL (ref 7.5–12.5)
Monocytes Relative: 13.1 %
Neutro Abs: 2315 cells/uL (ref 1500–7800)
Neutrophils Relative %: 46.3 %
Platelets: 278 10*3/uL (ref 140–400)
RBC: 4.05 10*6/uL (ref 3.80–5.10)
RDW: 12.9 % (ref 11.0–15.0)
Total Lymphocyte: 36.4 %
WBC: 5 10*3/uL (ref 3.8–10.8)

## 2021-03-25 LAB — LIPID PANEL
Cholesterol: 166 mg/dL (ref <200)
HDL: 91 mg/dL (ref >=50)
LDL Cholesterol (Calc): 61 mg/dL (calc)
Non-HDL Cholesterol (Calc): 75 mg/dL (calc) (ref <130)
Total CHOL/HDL Ratio: 1.8 (calc) (ref <5.0)
Triglycerides: 49 mg/dL (ref <150)

## 2021-03-28 ENCOUNTER — Other Ambulatory Visit: Payer: Self-pay

## 2021-03-28 DIAGNOSIS — R7989 Other specified abnormal findings of blood chemistry: Secondary | ICD-10-CM

## 2021-04-26 ENCOUNTER — Other Ambulatory Visit: Payer: Self-pay | Admitting: Family Medicine

## 2021-04-26 DIAGNOSIS — F4320 Adjustment disorder, unspecified: Secondary | ICD-10-CM

## 2021-04-26 DIAGNOSIS — F4321 Adjustment disorder with depressed mood: Secondary | ICD-10-CM

## 2021-05-08 ENCOUNTER — Telehealth: Payer: Self-pay | Admitting: Family Medicine

## 2021-05-08 ENCOUNTER — Telehealth (INDEPENDENT_AMBULATORY_CARE_PROVIDER_SITE_OTHER): Payer: BC Managed Care – PPO | Admitting: Family Medicine

## 2021-05-08 ENCOUNTER — Encounter: Payer: Self-pay | Admitting: Family Medicine

## 2021-05-08 VITALS — Ht 65.0 in | Wt 130.0 lb

## 2021-05-08 DIAGNOSIS — Z634 Disappearance and death of family member: Secondary | ICD-10-CM | POA: Diagnosis not present

## 2021-05-08 DIAGNOSIS — F4320 Adjustment disorder, unspecified: Secondary | ICD-10-CM | POA: Diagnosis not present

## 2021-05-08 DIAGNOSIS — F4321 Adjustment disorder with depressed mood: Secondary | ICD-10-CM

## 2021-05-08 DIAGNOSIS — R7989 Other specified abnormal findings of blood chemistry: Secondary | ICD-10-CM

## 2021-05-08 MED ORDER — LORAZEPAM 0.5 MG PO TABS: 0.5000 mg | ORAL_TABLET | Freq: Every day | ORAL | 0 refills | Status: DC | PRN

## 2021-05-08 MED ORDER — SERTRALINE HCL 100 MG PO TABS: 50.0000 mg | ORAL_TABLET | Freq: Every day | ORAL | 1 refills | Status: DC

## 2021-05-08 NOTE — Progress Notes (Signed)
? ?Name: Kaitlyn Harvey   MRN: [id number]    DOB: 10-28-1975   Date:05/08/2021 ? ?     Progress Note ? ?Subjective:  ? ? ?Chief Complaint ? ?Chief Complaint  ?Patient presents with  ? Follow-up  ? Depression  ? ? ?I connected with  [name]  on 05/08/21 at 10:20 AM EDT by a video enabled telemedicine application and verified that I am speaking with the correct person using two identifiers.  I discussed the limitations of evaluation and management by telemedicine and the availability of in person appointments. The patient expressed understanding and agreed to proceed. Staff also discussed with the patient that there may be a patient responsible charge related to this service. ?Patient Location: home ?Provider [facility] ?Additional Individuals present: none ? ?HPI ? ?F/up on starting meds for anxiety/depression - she hasn't seen any zoloft SE, but isn't sure how much its helping. ? ?  05/08/2021  ? 10:27 AM 03/24/2021  ?  1:21 PM 11/09/2020  ? 11:19 AM  ?Depression screen PHQ 2/9  ?Decreased Interest 0 1 0  ?Down, Depressed, Hopeless 2 1 0  ?PHQ - 2 Score 2 2 0  ?Altered sleeping 2 1 0  ?Tired, decreased energy 2 1 0  ?Change in appetite 0 1 0  ?Feeling bad or failure about yourself  0 0 0  ?Trouble concentrating 1 0 0  ?Moving slowly or fidgety/restless 0 0 0  ?Suicidal thoughts 0 0 0  ?PHQ-9 Score 7 5 0  ?Difficult doing work/chores Somewhat difficult Somewhat difficult Not difficult at all  ? ? ?  05/08/2021  ? 10:28 AM 04/01/2020  ?  3:44 PM  ?GAD 7 : Generalized Anxiety Score  ?Nervous, Anxious, on Edge 2 0  ?Control/stop worrying 2 0  ?Worry too much - different things 2 0  ?Trouble relaxing 2 0  ?Restless 2 0  ?Easily annoyed or irritable 2 0  ?Afraid - awful might happen 0 0  ?Total GAD 7 Score 12 0  ?Anxiety Difficulty Somewhat difficult Not difficult at all  ? ?Plan was to start SSRI after 03/24/21 visit ?Phq 9 and GAD 7 worse today - however she was doing her physical and really tried not to answer the  screening q's honestly last OV ? ? ? ? ? ?Patient Active Problem List  ? Diagnosis Date Noted  ? Nonrheumatic mitral valve regurgitation 03/24/2021  ? Lumbar facet arthropathy 02/25/2018  ? Lumbar spondylosis 02/25/2018  ? Chronic pain syndrome 02/25/2018  ? Lumbar degenerative disc disease 08/09/2015  ? 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 ? ?Social History  ? ?Tobacco Use  ? Smoking status: Former  ?  Packs/day: 0.50  ?  Years: 20.00  ?  Pack years: 10.00  ?  Types: Cigarettes  ?  Start date: 12/20/1994  ?  Quit date: 06/09/2015  ?  Years since quitting: 5.9  ? Smokeless tobacco: Never  ?Substance Use Topics  ? Alcohol use: Yes  ?  Alcohol/week: 5.0 standard drinks  ?  Types: 5 Glasses of wine per week  ? ? ? ?Current Outpatient Medications:  ?  acetaminophen (TYLENOL) 500 MG tablet, Take 500 mg by mouth every 6 (six) hours as needed., Disp: , Rfl:  ?  LORazepam (ATIVAN) 0.5 MG tablet, Take 1 tablet (0.5 mg total) by mouth daily as needed for anxiety (severe episodes of grief/anxiety/panic)., Disp: 10 tablet, Rfl: 0 ?  Multiple Vitamin (MULTIVITAMIN) tablet, Take 1 tablet by mouth daily., Disp: ,  Rfl:  ?  sertraline (ZOLOFT) 50 MG tablet, Take 1 tablet (50 mg total) by mouth at bedtime., Disp: 90 tablet, Rfl: 3 ? ?No Known Allergies ? ?I personally reviewed active problem list, medication list, allergies, family history, social history, health maintenance, notes from last encounter, lab results, imaging with the patient/caregiver today. ?Controlled substance database reviewed today ? ?Review of Systems  ?Constitutional: Negative.   ?HENT: Negative.    ?Eyes: Negative.   ?Respiratory: Negative.    ?Cardiovascular: Negative.   ?Gastrointestinal: Negative.   ?Endocrine: Negative.   ?Genitourinary: Negative.   ?Musculoskeletal: Negative.   ?Skin: Negative.   ?Allergic/Immunologic: Negative.   ?Neurological: Negative.   ?Hematological: Negative.   ?Psychiatric/Behavioral: Negative.    ?All other systems reviewed  and are negative.  ? ? ?Objective:  ? ?Virtual encounter, vitals limited, only able to obtain the following ?Today's Vitals  ? 05/08/21 1025  ?Weight: 130 lb (59 kg)  ?Height: 5' 5"  (1.651 m)  ? ?Body mass index is 21.63 kg/m?Marland Kitchen ?Nursing Note and Vital Signs reviewed. ? ?Physical Exam ?Vitals and nursing note reviewed.  ?Constitutional:   ?   Appearance: Normal appearance.  ?Pulmonary:  ?   Effort: No respiratory distress.  ?Neurological:  ?   Mental Status: She is alert. Mental status is at baseline.  ?Psychiatric:     ?   Mood and Affect: Mood is anxious. Affect is tearful. Affect is not inappropriate.     ?   Speech: Speech normal.     ?   Behavior: Behavior is hyperactive. Behavior is cooperative.  ? ? ?PE limited by virtual encounter ? ?No results found for this or any previous visit (from the past 72 hour(s)). ? ?Assessment and Plan:  ? ?  ICD-10-CM   ?1. Adjustment disorder, unspecified type  F43.20 LORazepam (ATIVAN) 0.5 MG tablet  ?  sertraline (ZOLOFT) 100 MG tablet  ? still having anxiety and depressive sx, increase dose of zoloft to 75 for a week, and then to 100 mg at bedtime  ?  ?2. Grief at loss of child  F43.21 LORazepam (ATIVAN) 0.5 MG tablet  ? Z63.4 sertraline (ZOLOFT) 100 MG tablet  ? save for severe sx/rescue med, sedating no driving no ETOH use with it, close f/up, reviewed controlled substance database   ?  ?3. Elevated LFTs  R79.89   ? reviewed labs, may have been from ETOH, will recheck labs at 3 month f/up in office  ?  ?  ?Therapy/psych may still be very helpful/needed ?Increase med dose with close f/up ?Suspect some underlying hyperactivity/anxiety prior to death of son ?She is also having insomnia frequently - OTC meds/supplements helpful - urged her not to use ativan for sleep - would recommend other meds ? ?-Red flags and when to present for emergency care or RTC including fever >101.100F, chest pain, shortness of breath, new/worsening/un-resolving symptoms, reviewed with patient at time  of visit. Follow up and care instructions discussed and provided in AVS. ?- I discussed the assessment and treatment plan with the patient. The patient was provided an opportunity to ask questions and all were answered. The patient agreed with the plan and demonstrated an understanding of the instructions. ? ?I provided 20+ minutes of non-face-to-face time during this encounter. ? ?Delsa Grana, PA-C ?05/08/21 11:04 AM ? ? ? ?

## 2021-05-08 NOTE — Telephone Encounter (Signed)
Pt is calling back. She missed a phone call. Please advise  ?

## 2021-05-17 ENCOUNTER — Telehealth (INDEPENDENT_AMBULATORY_CARE_PROVIDER_SITE_OTHER): Payer: BC Managed Care – PPO | Admitting: Family Medicine

## 2021-05-17 DIAGNOSIS — F4320 Adjustment disorder, unspecified: Secondary | ICD-10-CM | POA: Diagnosis not present

## 2021-05-17 DIAGNOSIS — F410 Panic disorder [episodic paroxysmal anxiety] without agoraphobia: Secondary | ICD-10-CM

## 2021-05-17 DIAGNOSIS — F4321 Adjustment disorder with depressed mood: Secondary | ICD-10-CM

## 2021-05-17 DIAGNOSIS — Z634 Disappearance and death of family member: Secondary | ICD-10-CM | POA: Diagnosis not present

## 2021-05-17 NOTE — Progress Notes (Signed)
? ?Name: Kaitlyn Harvey   MRN: [id number]    DOB: Mar 31, 1975   Date:05/17/2021 ? ?     Progress Note ? ?Subjective:  ? ? ?Chief Complaint ? ?Chief Complaint  ?Patient presents with  ? Consult  ?  Medication  ? ? ?I connected with  [name]  on 05/17/21 at  2:40 PM EDT by a video enabled telemedicine application and verified that I am speaking with the correct person using two identifiers.  I discussed the limitations of evaluation and management by telemedicine and the availability of in person appointments. The patient expressed understanding and agreed to proceed. Staff also discussed with the patient that there may be a patient responsible charge related to this service. ?Patient Location: home ?[facility] ?Additional Individuals present: none  ? ?HPI ? ?Pt presents for continued panic attacks and concerns that zoloft is not working.   ?Med doses were increased 9 days ago, from zoloft 50 mg - was supposed to increase to 75 then 100 gradually over the next couple weeks.  She has rescue meds. ? ?She has a severe melt down yesterday - the worst ever  ?She had a birthday party for her husband and her niece came and when they left she was sad and upset. They also went to her son's HS where they saw a memorial bench for him for the first time.  Driving home she was extremely emotional, she felt scared, she was upset, it lasted about 30 min.  ? ? ? ? ? ?Patient Active Problem List  ? Diagnosis Date Noted  ? Nonrheumatic mitral valve regurgitation 03/24/2021  ? Lumbar facet arthropathy 02/25/2018  ? Lumbar spondylosis 02/25/2018  ? Chronic pain syndrome 02/25/2018  ? Lumbar degenerative disc disease 08/09/2015  ? 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 ? ?Social History  ? ?Tobacco Use  ? Smoking status: Former  ?  Packs/day: 0.50  ?  Years: 20.00  ?  Pack years: 10.00  ?  Types: Cigarettes  ?  Start date: 12/20/1994  ?  Quit date: 06/09/2015  ?  Years since quitting: 5.9  ? Smokeless tobacco: Never   ?Substance Use Topics  ? Alcohol use: Yes  ?  Alcohol/week: 5.0 standard drinks  ?  Types: 5 Glasses of wine per week  ? ? ? ?Current Outpatient Medications:  ?  acetaminophen (TYLENOL) 500 MG tablet, Take 500 mg by mouth every 6 (six) hours as needed., Disp: , Rfl:  ?  LORazepam (ATIVAN) 0.5 MG tablet, Take 1 tablet (0.5 mg total) by mouth daily as needed for anxiety (severe episodes of grief/anxiety/panic). To last 3 or more months, Disp: 10 tablet, Rfl: 0 ?  Multiple Vitamin (MULTIVITAMIN) tablet, Take 1 tablet by mouth daily., Disp: , Rfl:  ?  sertraline (ZOLOFT) 100 MG tablet, Take 0.5 tablets (50 mg total) by mouth at bedtime., Disp: 90 tablet, Rfl: 1 ? ?No Known Allergies ? ? ? ?Review of Systems  ?Constitutional: Negative.   ?HENT: Negative.    ?Eyes: Negative.   ?Respiratory: Negative.    ?Cardiovascular: Negative.   ?Gastrointestinal: Negative.   ?Endocrine: Negative.   ?Genitourinary: Negative.   ?Musculoskeletal: Negative.   ?Skin: Negative.   ?Allergic/Immunologic: Negative.   ?Neurological: Negative.   ?Hematological: Negative.   ?Psychiatric/Behavioral:  Positive for dysphoric mood. Negative for self-injury and suicidal ideas. The patient is nervous/anxious.   ?All other systems reviewed and are negative.  ? ? ?Objective:  ? ?Virtual encounter, vitals limited, only able  to obtain the following ?There were no vitals filed for this visit. ?There is no height or weight on file to calculate BMI. ?Nursing Note and Vital Signs reviewed. ? ?Physical Exam ?Vitals and nursing note reviewed.  ?Psychiatric:     ?   Mood and Affect: Affect is tearful.  ? ? ?PE limited by virtual encounter ? ?No results found for this or any previous visit (from the past 72 hour(s)). ? ?Assessment and Plan:  ? ?  ICD-10-CM   ?1. Adjustment disorder, unspecified type  F43.20   ?  ?2. Grief at loss of child  F43.21   ? Z63.4   ?  ?3. Panic attacks  F41.0   ?  ?  ?Instructions given to pt for sx- continue zoloft dose increase,  continue to use benzo sparingly for severe sx and strongly recommended more intense therapy with trained mental health professional -  ? ?AVS: Look up psychology today therapists in the area that do EMDR or other cognitive behavioral therapy ?I think these more specialized therapies with other medications will be the most helpful. ? ?Still keep the zoloft medication dose increased - to 75 mg and then 100 mg and it will take several weeks to work and the medicines for these types of symptoms are more effective at these higher doses.  ? ? ? ?I provided 27+ min for encounter plus additional 5 minutes for documentation of non-face-to-face time during this encounter. ? ?Delsa Grana, PA-C ?05/17/21 2:43 PM ? ? ? ?

## 2021-05-17 NOTE — Patient Instructions (Addendum)
Look up psychology today therapists in the area that do EMDR or other cognitive behavioral therapy ?I think these more specialized therapies with other medications will be the most helpful. ? ?Still keep the zoloft medication dose increased - to 75 mg and then 100 mg and it will take several weeks to work and the medicines for these types of symptoms are more effective at these higher doses.  ? ? ?

## 2021-05-18 ENCOUNTER — Telehealth: Payer: Self-pay | Admitting: Internal Medicine

## 2021-05-18 NOTE — Telephone Encounter (Signed)
Made 3 attempts to schedule, removing from recall list.

## 2021-05-25 ENCOUNTER — Telehealth: Payer: Self-pay

## 2021-05-25 NOTE — Telephone Encounter (Signed)
Copied from [email or web address]. Topic: General - Other >> May 25, 2021  1:37 PM Yvette Rack wrote: Reason for CRM: Pt stated she needs to speak with [facility] regarding Rx for sertraline (ZOLOFT) 100 MG tablet and to discuss referral for cognitive therapy. Cb# [phone]

## 2021-05-31 ENCOUNTER — Telehealth: Payer: Self-pay | Admitting: Family Medicine

## 2021-05-31 ENCOUNTER — Other Ambulatory Visit: Payer: Self-pay

## 2021-05-31 ENCOUNTER — Ambulatory Visit: Payer: BC Managed Care – PPO | Admitting: Family Medicine

## 2021-05-31 DIAGNOSIS — Z634 Disappearance and death of family member: Secondary | ICD-10-CM

## 2021-05-31 NOTE — Telephone Encounter (Signed)
Copied from [place] [id number]. Topic: Referral - Request for Referral >> May 31, 2021  2:51 PM Erick Blinks wrote: Has patient seen PCP for this complaint? Yes.   *If NO, is insurance requiring patient see PCP for this issue before PCP can refer them? Referral for which specialty: Psychiatry  Preferred provider/office: [facility]  Reason for referral: Pt lost 47 year old son in 2021

## 2021-05-31 NOTE — Telephone Encounter (Signed)
Referral placed.

## 2021-07-05 ENCOUNTER — Encounter: Payer: Self-pay | Admitting: Psychiatry

## 2021-07-05 ENCOUNTER — Ambulatory Visit (INDEPENDENT_AMBULATORY_CARE_PROVIDER_SITE_OTHER): Payer: BC Managed Care – PPO | Admitting: Psychiatry

## 2021-07-05 VITALS — BP 136/90 | HR 67 | Ht 65.0 in | Wt 128.0 lb

## 2021-07-05 DIAGNOSIS — R7989 Other specified abnormal findings of blood chemistry: Secondary | ICD-10-CM | POA: Insufficient documentation

## 2021-07-05 DIAGNOSIS — F411 Generalized anxiety disorder: Secondary | ICD-10-CM | POA: Diagnosis not present

## 2021-07-05 DIAGNOSIS — Z79899 Other long term (current) drug therapy: Secondary | ICD-10-CM | POA: Insufficient documentation

## 2021-07-05 DIAGNOSIS — F4381 Prolonged grief disorder: Secondary | ICD-10-CM | POA: Insufficient documentation

## 2021-07-05 MED ORDER — MIRTAZAPINE 7.5 MG PO TABS: 7.5000 mg | ORAL_TABLET | Freq: Every day | ORAL | 1 refills | Status: DC

## 2021-07-05 MED ORDER — SERTRALINE HCL 100 MG PO TABS
100.0000 mg | ORAL_TABLET | Freq: Every day | ORAL | 1 refills | Status: DC
Start: 2021-07-05 — End: 2021-11-03

## 2021-07-05 MED ORDER — LORAZEPAM 0.5 MG PO TABS: 0.5000 mg | ORAL_TABLET | ORAL | 1 refills | Status: DC
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
## 2021-07-05 NOTE — Patient Instructions (Addendum)
Dr.Elizabeth Ross - On life after death    Lorazepam Tablets What is this medication? LORAZEPAM (lor A ze pam) treats anxiety. It works by Education administrator system slow down. It belongs to a group of medications called benzodiazepines. This medicine may be used for other purposes; ask your health care provider or pharmacist if you have questions. COMMON BRAND NAME(S): Ativan What should I tell my care team before I take this medication? They need to know if you have any of these conditions: Glaucoma History of drug or alcohol abuse problem Kidney disease Liver disease Lung or breathing disease, like asthma Mental illness Myasthenia gravis Parkinson disease Suicidal thoughts, plans, or attempt; a previous suicide attempt by you or a family member An unusual or allergic reaction to lorazepam, other medications, foods, dyes, or preservatives Pregnant or trying to get pregnant Breast-feeding How should I use this medication? Take this medication by mouth with a glass of water. Follow the directions on the prescription label. Take your medication at regular intervals. Do not take it more often than directed. Do not stop taking except on your care team's advice. A special MedGuide will be given to you by the pharmacist with each prescription and refill. Be sure to read this information carefully each time. Talk to your care team regarding the use of this medication in children. While this medication may be used in children as young as 12 years for selected conditions, precautions do apply. Overdosage: If you think you have taken too much of this medicine contact a poison control center or emergency room at once. NOTE: This medicine is only for you. Do not share this medicine with others. What if I miss a dose? If you miss a dose, take it as soon as you can. If it is almost time for your next dose, take only that dose. Do not take double or extra doses. What may interact with this  medication? Do not take this medication with any of the following: Narcotic medications for cough Sodium oxybate This medication may also interact with the following: Alcohol Antihistamines for allergy, cough and cold Certain medications for anxiety or sleep Certain medications for depression, like amitriptyline, fluoxetine, sertraline Certain medications for seizures like carbamazepine, phenobarbital, phenytoin, primidone General anesthetics like lidocaine, pramoxine, tetracaine MAOIs like Carbex, Eldepryl, Marplan, Nardil, and Parnate Medications that relax muscles for surgery Narcotic medications for pain Phenothiazines like chlorpromazine, mesoridazine, prochlorperazine, thioridazine This list may not describe all possible interactions. Give your health care provider a list of all the medicines, herbs, non-prescription drugs, or dietary supplements you use. Also tell them if you smoke, drink alcohol, or use illegal drugs. Some items may interact with your medicine. What should I watch for while using this medication? Tell your care team if your symptoms do not start to get better or if they get worse. Do not stop taking except on your care team's advice. You may develop a severe reaction. Your care team will tell you how much medication to take. You may get drowsy or dizzy. Do not drive, use machinery, or do anything that needs mental alertness until you know how this medication affects you. To reduce the risk of dizzy and fainting spells, do not stand or sit up quickly, especially if you are an older patient. Alcohol may increase dizziness and drowsiness. Avoid alcoholic drinks. If you are taking another medication that also causes drowsiness, you may have more side effects. Give your care team a list of all medications you use.  Your care team will tell you how much medication to take. Do not take more medication than directed. Call emergency service for help if you have problems breathing  or unusual sleepiness. What side effects may I notice from receiving this medication? Side effects that you should report to your care team as soon as possible: Allergic reactions--skin rash, itching, hives, swelling of the face, lips, tongue, or throat CNS depression--slow or shallow breathing, shortness of breath, feeling faint, dizziness, confusion, difficulty staying awake Thoughts of suicide or self-harm, worsening mood, feelings of depression Side effects that usually do not require medical attention (report to your care team if they continue or are bothersome): Dizziness Drowsiness Headache Nausea Vomiting This list may not describe all possible side effects. Call your doctor for medical advice about side effects. You may report side effects to FDA at 1-800-FDA-1088. Where should I keep my medication? Keep out of the reach of children. This medication can be abused. Keep your medication in a safe place to protect it from theft. Do not share this medication with anyone. Selling or giving away this medication is dangerous and against the law. Store at room temperature between 20 and 25 degrees C (68 and 77 degrees F). Protect from light. Keep container tightly closed. This medication may cause accidental overdose and death if taken by other adults, children, or pets. Mix any unused medication with a substance like cat litter or coffee grounds. Then throw the medication away in a sealed container like a sealed bag or a coffee can with a lid. Do not use the medication after the expiration date. NOTE: This sheet is a summary. It may not cover all possible information. If you have questions about this medicine, talk to your doctor, pharmacist, or health care provider.  2023 Elsevier/Gold Standard (2019-12-17 00:00:00) Mirtazapine Tablets What is this medication? MIRTAZAPINE (mir TAZ a peen) treats depression. It increases the amount of serotonin and norepinephrine in the brain, hormones that  help regulate mood. This medicine may be used for other purposes; ask your health care provider or pharmacist if you have questions. COMMON BRAND NAME(S): Remeron What should I tell my care team before I take this medication? They need to know if you have any of these conditions: Bipolar disorder Glaucoma Kidney disease Liver disease Suicidal thoughts An unusual or allergic reaction to mirtazapine, other medications, foods, dyes, or preservatives Pregnant or trying to get pregnant Breast-feeding How should I use this medication? Take this medication by mouth with a glass of water. Follow the directions on the prescription label. Take your medication at regular intervals. Do not take your medication more often than directed. Do not stop taking this medication suddenly except upon the advice of your care team. Stopping this medication too quickly may cause serious side effects or your condition may worsen. A special MedGuide will be given to you by the pharmacist with each prescription and refill. Be sure to read this information carefully each time. Talk to your care team about the use of this medication in children. Special care may be needed. Overdosage: If you think you have taken too much of this medicine contact a poison control center or emergency room at once. NOTE: This medicine is only for you. Do not share this medicine with others. What if I miss a dose? If you miss a dose, take it as soon as you can. If it is almost time for your next dose, take only that dose. Do not take double or extra doses.  What may interact with this medication? Do not take this medication with any of the following: Linezolid MAOIs like Carbex, Eldepryl, Marplan, Nardil, and Parnate Methylene blue (injected into a vein) This medication may also interact with the following: Alcohol Antiviral medications for HIV or AIDS Certain medications that treat or prevent blood clots like warfarin Certain  medications for depression, anxiety, or psychotic disturbances Certain medications for fungal infections like ketoconazole and itraconazole Certain medications for migraine headache like almotriptan, eletriptan, frovatriptan, naratriptan, rizatriptan, sumatriptan, zolmitriptan Certain medications for seizures like carbamazepine or phenytoin Certain medications for sleep Cimetidine Erythromycin Fentanyl Lithium Medications for blood pressure Nefazodone Rasagiline Rifampin Supplements like St. John's wort, kava kava, valerian Tramadol Tryptophan This list may not describe all possible interactions. Give your health care provider a list of all the medicines, herbs, non-prescription drugs, or dietary supplements you use. Also tell them if you smoke, drink alcohol, or use illegal drugs. Some items may interact with your medicine. What should I watch for while using this medication? Tell your care team if your symptoms do not get better or if they get worse. Visit your care team for regular checks on your progress. Because it may take several weeks to see the full effects of this medication, it is important to continue your treatment as prescribed by your doctor. Patients and their families should watch out for new or worsening thoughts of suicide or depression. Also watch out for sudden changes in feelings such as feeling anxious, agitated, panicky, irritable, hostile, aggressive, impulsive, severely restless, overly excited and hyperactive, or not being able to sleep. If this happens, especially at the beginning of treatment or after a change in dose, call your care team. You may get drowsy or dizzy. Do not drive, use machinery, or do anything that needs mental alertness until you know how this medication affects you. Do not stand or sit up quickly, especially if you are an older patient. This reduces the risk of dizzy or fainting spells. Alcohol may interfere with the effect of this medication.  Avoid alcoholic drinks. This medication may cause dry eyes and blurred vision. If you wear contact lenses you may feel some discomfort. Lubricating drops may help. See your eye care team if the problem does not go away or is severe. Your mouth may get dry. Chewing sugarless gum or sucking hard candy, and drinking plenty of water may help. Contact your care team if the problem does not go away or is severe. What side effects may I notice from receiving this medication? Side effects that you should report to your care team as soon as possible: Allergic reactions--skin rash, itching, hives, swelling of the face, lips, tongue, or throat Heart rhythm changes--fast or irregular heartbeat, dizziness, feeling faint or lightheaded, chest pain, trouble breathing Infection--fever, chills, cough, or sore throat Irritability, confusion, fast or irregular heartbeat, muscle stiffness, twitching muscles, sweating, high fever, seizure, chills, vomiting, diarrhea, which may be signs of serotonin syndrome Low sodium level--muscle weakness, fatigue, dizziness, headache, confusion Rash, fever, and swollen lymph nodes Redness, blistering, peeling or loosening of the skin, including inside the mouth Seizures Sudden eye pain or change in vision such as blurry vision, seeing halos around lights, vision loss Thoughts of suicide or self-harm, worsening mood, feelings of depression Side effects that usually do not require medical attention (report to your care team if they continue or are bothersome): Constipation Dizziness Drowsiness Dry mouth Increase in appetite Weight gain This list may not describe all possible side effects.  Call your doctor for medical advice about side effects. You may report side effects to FDA at 1-800-FDA-1088. Where should I keep my medication? Keep out of the reach of children. Store at room temperature between 15 and 30 degrees C (59 and 86 degrees F) Protect from light and moisture. Throw  away any unused medication after the expiration date. NOTE: This sheet is a summary. It may not cover all possible information. If you have questions about this medicine, talk to your doctor, pharmacist, or health care provider.  2023 Elsevier/Gold Standard (2020-03-16 00:00:00)

## 2021-07-24 ENCOUNTER — Encounter: Payer: Self-pay | Admitting: Physician Assistant

## 2021-07-24 ENCOUNTER — Ambulatory Visit: Payer: Self-pay

## 2021-07-24 ENCOUNTER — Telehealth: Payer: BC Managed Care – PPO | Admitting: Physician Assistant

## 2021-07-24 DIAGNOSIS — R3989 Other symptoms and signs involving the genitourinary system: Secondary | ICD-10-CM

## 2021-07-24 MED ORDER — NITROFURANTOIN MONOHYD MACRO 100 MG PO CAPS: 100.0000 mg | ORAL_CAPSULE | Freq: Two times a day (BID) | ORAL | 0 refills | Status: DC

## 2021-07-24 NOTE — Progress Notes (Signed)
Duplicate encounter created by patient. She completed an E-visit. Will mark Video visit erroneous.

## 2021-07-24 NOTE — Progress Notes (Signed)

## 2021-07-24 NOTE — Telephone Encounter (Signed)
Reason for Disposition  All other patients with painful urination  (Exception: [1] EITHER frequency or urgency AND [2] has on-call doctor.)  Answer Assessment - Initial Assessment Questions 1. SYMPTOM: "What's the main symptom you're concerned about?" (e.g., frequency, incontinence)     3 days ago, burning,  2. ONSET: "When did the  pressure  start?"     3 days 3. PAIN: "Is there any pain?" If Yes, ask: "How bad is it?" (Scale: 1-10; mild, moderate, severe)     Yes mild 4. CAUSE: "What do you think is causing the symptoms?"     Salt water pool 5. OTHER SYMPTOMS: "Do you have any other symptoms?" (e.g., blood in urine, fever, flank pain, pain with urination)     Pain with urination 6. PREGNANCY: "Is there any chance you are pregnant?" "When was your last menstrual period?"     *No Answer*  Answer Assessment - Initial Assessment Questions 1. SEVERITY: "How bad is the pain?"  (e.g., Scale 1-10; mild, moderate, or severe)   - MILD (1-3): complains slightly about urination hurting   - MODERATE (4-7): interferes with normal activities     - SEVERE (8-10): excruciating, unwilling or unable to urinate because of the pain      mild 2. FREQUENCY: "How many times have you had painful urination today?"      N/a 3. PATTERN: "Is pain present every time you urinate or just sometimes?"      Every time 4. ONSET: "When did the painful urination start?"      3 days ago 5. FEVER: "Do you have a fever?" If Yes, ask: "What is your temperature, how was it measured, and when did it start?"     no 6. PAST UTI: "Have you had a urine infection before?" If Yes, ask: "When was the last time?" and "What happened that time?"      Yes-abx 7. CAUSE: "What do you think is causing the painful urination?"  (e.g., UTI, scratch, Herpes sore)     UTI 8. OTHER SYMPTOMS: "Do you have any other symptoms?" (e.g., blood in urine, flank pain, genital sores, urgency, vaginal discharge)     No- bladder pressure  Protocols  used: Urinary Symptoms-A-AH, Urination Pain - Female-A-AH

## 2021-07-24 NOTE — Telephone Encounter (Signed)
Unable to find OV or VV with provider. No available appts virtually with providers. Pt given appt via MyChart VV.

## 2021-07-27 ENCOUNTER — Telehealth: Payer: Self-pay

## 2021-07-27 NOTE — Telephone Encounter (Signed)
Noted. Will forward this to Dr. Elna Breslow so that she is aware of this.

## 2021-07-27 NOTE — Telephone Encounter (Signed)
called patient she states she stopped taking because she was too sleepy and it made her feel off. so she was just letting up know that she stoppled taking the remeron

## 2021-07-27 NOTE — Telephone Encounter (Signed)
pt left voicemail that she not going to take the remeron.

## 2021-08-08 ENCOUNTER — Ambulatory Visit: Payer: BC Managed Care – PPO | Admitting: Family Medicine

## 2021-08-09 ENCOUNTER — Telehealth (INDEPENDENT_AMBULATORY_CARE_PROVIDER_SITE_OTHER): Payer: Self-pay | Admitting: Psychiatry

## 2021-08-09 DIAGNOSIS — F411 Generalized anxiety disorder: Secondary | ICD-10-CM

## 2021-08-09 NOTE — Progress Notes (Signed)
No response to call or text or video invite  

## 2021-08-10 ENCOUNTER — Ambulatory Visit: Payer: Self-pay | Admitting: Psychiatry

## 2021-08-11 ENCOUNTER — Ambulatory Visit: Payer: BC Managed Care – PPO | Admitting: Family Medicine

## 2021-09-05 ENCOUNTER — Other Ambulatory Visit: Payer: Self-pay | Admitting: Psychiatry

## 2021-09-05 DIAGNOSIS — F4381 Prolonged grief disorder: Secondary | ICD-10-CM

## 2021-09-05 DIAGNOSIS — F411 Generalized anxiety disorder: Secondary | ICD-10-CM

## 2021-11-03 ENCOUNTER — Other Ambulatory Visit: Payer: Self-pay | Admitting: Family Medicine

## 2021-11-03 ENCOUNTER — Telehealth: Payer: Self-pay | Admitting: Psychiatry

## 2021-11-03 DIAGNOSIS — F411 Generalized anxiety disorder: Secondary | ICD-10-CM

## 2021-11-03 MED ORDER — SERTRALINE HCL 100 MG PO TABS: 100.0000 mg | ORAL_TABLET | Freq: Every day | ORAL | 0 refills | Status: DC

## 2021-11-03 NOTE — Telephone Encounter (Signed)
Will have staff contact patient to schedule an appointment since received a request from primary care about medication refill for this patient.  She has been noncompliant.

## 2021-11-03 NOTE — Telephone Encounter (Signed)
Patient has been noncomplaint with follow up visits, please call and schedule an appointment and medications will be given for one time 30 days supply for now .

## 2021-11-14 ENCOUNTER — Ambulatory Visit: Payer: BC Managed Care – PPO | Admitting: Family Medicine

## 2021-11-14 ENCOUNTER — Encounter: Payer: Self-pay | Admitting: Family Medicine

## 2021-11-14 VITALS — BP 124/84 | HR 91 | Temp 97.8°F | Resp 16 | Ht 65.0 in | Wt 136.7 lb

## 2021-11-14 DIAGNOSIS — G44329 Chronic post-traumatic headache, not intractable: Secondary | ICD-10-CM

## 2021-11-14 DIAGNOSIS — F411 Generalized anxiety disorder: Secondary | ICD-10-CM

## 2021-11-14 DIAGNOSIS — Z79899 Other long term (current) drug therapy: Secondary | ICD-10-CM

## 2021-11-14 DIAGNOSIS — S0990XA Unspecified injury of head, initial encounter: Secondary | ICD-10-CM

## 2021-11-14 DIAGNOSIS — F4381 Prolonged grief disorder: Secondary | ICD-10-CM

## 2021-11-14 MED ORDER — NORTRIPTYLINE HCL 10 MG PO CAPS: 10.0000 mg | ORAL_CAPSULE | Freq: Every day | ORAL | 2 refills | Status: AC

## 2021-11-14 MED ORDER — LORAZEPAM 0.5 MG PO TABS: 0.5000 mg | ORAL_TABLET | ORAL | 0 refills | Status: AC

## 2021-11-14 MED ORDER — BUSPIRONE HCL 10 MG PO TABS: 10.0000 mg | ORAL_TABLET | Freq: Three times a day (TID) | ORAL | 3 refills | Status: AC | PRN

## 2021-11-14 NOTE — Patient Instructions (Signed)
Concussion, Adult  A concussion is a brain injury from a hard, direct hit (trauma) to your head or body. This hit causes your brain to quickly shake back and forth inside your skull. A concussion may also be called a mild traumatic brain injury (TBI). Healing from this injury can take time. The effects of a concussion can be serious. If you have a concussion, you should be very careful to avoid having a second concussion. What are the causes? This condition is caused by: A direct hit to your head. A quick and sudden movement of the head or neck, such as in a car crash. What are the signs or symptoms? The signs of a concussion can be hard to notice. They may be missed by you, family members, and doctors. You may look fine on the outside but may not act or feel normal. Physical symptoms Headaches or feeling dizzy. Problems with body balance. Being sensitive to light or noise. Vomiting or feeling like you may vomit. Being tired. Problems seeing or hearing. Seizure. Mental and emotional symptoms Feeling grouchy (irritable) or having mood changes. Problems remembering things. Trouble focusing your mind (concentrating), organizing, or making decisions. Not sleeping or eating as you used to. Being slow to think, act, react, speak, or read. Feeling worried or nervous (anxious). Feeling sad (depressed). How is this treated? This condition may be treated by: Stopping sports or activity if you are injured. Resting your body and your mind. Being watched carefully, often at home. Medicines to help with symptoms such as: Headaches. Feeling like you may vomit. Problems with sleep. You may need to go to a concussion clinic or a place to help you recover (rehab). Follow these instructions at home: Activity Limit activities that need a lot of thought or focus, such as: Homework or work for your job. Watching TV. Using the computer or phone. Playing memory games and puzzles. Get rest because  this helps your brain heal. Make sure you: Get plenty of sleep. Most adults should get 7-9 hours of sleep each night. Rest during the day. Take naps or breaks when you feel tired. Avoid activity or exercise that takes a lot of effort until your doctor says it is safe. Stop any activity that makes symptoms worse. Your doctor may tell you to do light exercise like walking. Do not do activities that could cause a second concussion, such as riding a bike or playing sports. Ask your doctor when you can return to your normal activities, such as school, work, sports, and driving. Your ability to react may be slower. Do not do these activities if you are dizzy. General instructions  Take over-the-counter and prescription medicines only as told by your doctor. Avoid taking strong pain medicines (opioids) after a concussion. Do not drink alcohol until your doctor says you can. Watch your symptoms and tell other people to do the same. Other problems can occur after a concussion. Tell your work manager, teachers, school nurse, school counselor, coach, or athletic trainer about your injury and symptoms. Tell them about what you can or cannot do. See a mental health therapist if you keep feeling worried and nervous or sad. Keep all follow-up visits. Your doctor will check on your recovery and give you a plan for returning to activities. How is this prevented? It is very important that you do not get another brain injury. In rare cases, another injury can cause brain damage that will not go away, brain swelling, or death. The risk of this   is greatest in the first 7-10 days after a head injury. To avoid injuries: Stop activities that could lead to a second concussion, such as contact sports, until your doctor says it is okay. When you return to sports or activities: Do not crash into other players. This is how most concussions happen. Follow the rules. Respect other players. Do not engage in violent  behavior while playing. Get regular exercise. Do strength and balance training. Wear a helmet that fits you well during sports, biking, or other activities. Helmets can help protect you from serious skull and brain injuries, but they may not protect you from a concussion. Even when wearing a helmet, you should avoid being hit in the head. Where to find more information Centers for Disease Control and Prevention: cdc.gov Contact a doctor if: Your symptoms do not get better or get worse. You have new symptoms. You have another injury. Your balance gets worse. You have changes in how you act. Get help right away if: You have very bad headaches or your headaches get worse. You have any of these problems: Feeling weak or numb in any part of your body. Slurred speech. Changes in how you see (vision). Feeling mixed up (confused). You vomit often. You faint or other people have trouble waking you up. You have a seizure. These symptoms may be an emergency. Get help right away. Call 911. Do not wait to see if the symptoms will go away. Do not drive yourself to the hospital. Also, get help right away if: You have thoughts of hurting yourself or others. Take one of these steps if you feel like you may hurt yourself or others, or have thoughts about taking your own life: Go to your nearest emergency room. Call 911. Call the National Suicide Prevention Lifeline at 1-800-273-8255 or 988. This is open 24 hours a day. Text the Crisis Text Line at 741741. This information is not intended to replace advice given to you by your health care provider. Make sure you discuss any questions you have with your health care provider. Document Revised: 05/12/2021 Document Reviewed: 05/12/2021 Elsevier Patient Education  2023 [employer]  

## 2021-11-14 NOTE — Progress Notes (Unsigned)
Patient ID: Kaitlyn Harvey, female    DOB: 1975-04-11, 46 y.o.   MRN: [id number]  PCP: Delsa Grana, PA-C  Chief Complaint  Patient presents with   Numbness    Pt states onset for 2-3 weeks, she feels the back of her right side of head feels numb/hurts. Unsure what is causing it she did hit her head a few weeks ago   Pain    Subjective:   Kaitlyn Harvey is a 46 y.o. female, presents to clinic with CC of the following:  Hit back right side of head stood up quickly and hit very hard on steel safe and since then she's been having right neck pain and right sided occipital HA's, they are there upon waking, husband reports mood changes when having pain, she is able to take a extrastrength tylenol and it goes away throughout the day. At time of injury no eval, she was in [place] - happened 1-3 weeks ago - cannot tell me exactly when, no LOC   Head Injury  The incident occurred more than 1 week ago. The injury mechanism was a direct blow. There was no loss of consciousness. There was no blood loss. The quality of the pain is described as aching and shooting (fluid feeling and numbness). The pain is moderate. Associated symptoms include headaches. Pertinent negatives include no blurred vision, disorientation, memory loss, numbness, tinnitus, vomiting or weakness. She has tried acetaminophen for the symptoms. The treatment provided mild relief.  Headache  This is a new problem. The problem occurs daily. The problem has been unchanged. The pain is located in the Occipital (right occipital and pain from neck) region. The pain radiates to the right neck. The pain quality is not similar to prior headaches. Associated symptoms include muscle aches, neck pain, scalp tenderness and tingling. Pertinent negatives include no abdominal pain, abnormal behavior, anorexia, back pain, blurred vision, coughing, dizziness, drainage, ear pain, eye pain, eye redness, eye watering, facial sweating, fever, hearing loss, insomnia,  loss of balance, numbness, phonophobia, photophobia, rhinorrhea, seizures, sinus pressure, sore throat, swollen glands, tinnitus, visual change, vomiting, weakness or weight loss. Nothing aggravates the symptoms. She has tried acetaminophen for the symptoms. The treatment provided moderate relief. Her past medical history is significant for recent head traumas. There is no history of cluster headaches, hypertension, migraine headaches, obesity or sinus disease.    Adjustment disorder, anxiety, prolonged grief  Pt did not f/up with psychiatry, she isn't interested in doing therapy She was not taking zoloft regularly and hasn't taken it in a while She does want buspar again, that was helpful when she needed it, and she would like a refill of ativan for severe sx/ptsd for the holidays with 2nd anniversary of her sons diagnosis, illness and death     December 11, 2021    3:36 PM 07/05/2021   11:55 AM 05/17/2021    2:24 PM  Depression screen PHQ 2/9  Decreased Interest 1  1  Down, Depressed, Hopeless 1  1  PHQ - 2 Score 2  2  Altered sleeping 1  1  Tired, decreased energy 0  0  Change in appetite 0  0  Feeling bad or failure about yourself  0  1  Trouble concentrating 0  0  Moving slowly or fidgety/restless 0  0  Suicidal thoughts 0  0  PHQ-9 Score 3  4  Difficult doing work/chores Somewhat difficult       Information is confidential and restricted. Go to Review Flowsheets to  unlock data.     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   Abnormal LFTs 07/05/2021   High risk medication use 07/05/2021   Nonrheumatic mitral valve regurgitation 03/24/2021   Lumbar facet arthropathy 02/25/2018   Lumbar spondylosis 02/25/2018   Chronic pain syndrome 02/25/2018   Lumbar degenerative disc disease 08/09/2015   Sacroiliac joint dysfunction 08/09/2015      Current Outpatient Medications:     acetaminophen (TYLENOL) 500 MG tablet, Take 500 mg by mouth every 6 (six) hours as needed., Disp: , Rfl:    busPIRone (BUSPAR) 10 MG tablet, Take 1 tablet (10 mg total) by mouth 3 (three) times daily as needed., Disp: 60 tablet, Rfl: 3   LORazepam (ATIVAN) 0.5 MG tablet, Take 1 tablet (0.5 mg total) by mouth as directed. Take 1 tablet 1-2 times a week for severe anxiety attacks only - please limit use, Disp: 12 tablet, Rfl: 1   nortriptyline (PAMELOR) 10 MG capsule, Take 1 capsule (10 mg total) by mouth at bedtime., Disp: 30 capsule, Rfl: 2   Multiple Vitamin (MULTIVITAMIN) tablet, Take 1 tablet by mouth daily. (Patient not taking: Reported on 11/14/2021), Disp: , Rfl:    No Known Allergies   Social History   Tobacco Use   Smoking status: Former    Packs/day: 0.50    Years: 20.00    Total pack years: 10.00    Types: Cigarettes    Start date: 12/20/1994    Quit date: 06/09/2015    Years since quitting: 6.4   Smokeless tobacco: Never  Vaping Use   Vaping Use: Never used  Substance Use Topics   Alcohol use: Yes    Alcohol/week: 5.0 standard drinks of alcohol    Types: 5 Glasses of wine per week   Drug use: No      Chart Review Today: I personally reviewed active problem list, medication list, allergies, family history, social history, health maintenance, notes from last encounter, lab results, imaging with the patient/caregiver today.   Review of Systems  Constitutional: Negative.  Negative for fever and weight loss.  HENT: Negative.  Negative for ear pain, hearing loss, rhinorrhea, sinus pressure, sore throat and tinnitus.   Eyes: Negative.  Negative for blurred vision, photophobia, pain and redness.  Respiratory: Negative.  Negative for cough.   Cardiovascular: Negative.   Gastrointestinal: Negative.  Negative for abdominal pain, anorexia and vomiting.  Endocrine: Negative.   Genitourinary: Negative.   Musculoskeletal:  Positive for neck pain. Negative for back pain.   Skin: Negative.   Allergic/Immunologic: Negative.   Neurological:  Positive for tingling and headaches. Negative for dizziness, seizures, weakness, numbness and loss of balance.  Hematological: Negative.   Psychiatric/Behavioral: Negative.  Negative for memory loss. The patient does not have insomnia.   All other systems reviewed and are negative.      Objective:   Vitals:   11/14/21 1536  BP: 124/84  Pulse: 91  Resp: 16  Temp: 97.8 F (36.6 C)  TempSrc: Oral  SpO2: 98%  Weight: 136 lb 11.2 oz (62 kg)  Height: _0  (1.651 m)    Body mass index is 22.75 kg/m.  Physical Exam Vitals and nursing note reviewed.  Constitutional:      General: She is not in acute distress.    Appearance: Normal appearance. She is well-developed and normal weight. She is not ill-appearing, toxic-appearing or diaphoretic.  HENT:  Head: Normocephalic and atraumatic. No raccoon eyes, Battle's sign, abrasion, contusion, masses, right periorbital erythema, left periorbital erythema or laceration. Hair is normal.     Jaw: There is normal jaw occlusion.     Right Ear: External ear normal.     Left Ear: External ear normal.     Nose: Nose normal.  Eyes:     General: No scleral icterus.       Right eye: No discharge.        Left eye: No discharge.     Extraocular Movements: Extraocular movements intact.     Conjunctiva/sclera: Conjunctivae normal.     Pupils: Pupils are equal, round, and reactive to light.  Neck:     Trachea: Trachea and phonation normal. No tracheal deviation.  Cardiovascular:     Rate and Rhythm: Normal rate and regular rhythm.     Pulses: Normal pulses.     Heart sounds: Normal heart sounds. No murmur heard.    No friction rub. No gallop.  Pulmonary:     Effort: Pulmonary effort is normal. No respiratory distress.     Breath sounds: Normal breath sounds. No stridor.  Abdominal:     General: Bowel sounds are normal.     Palpations: Abdomen is soft.  Musculoskeletal:         General: Normal range of motion.     Cervical back: Full passive range of motion without pain, normal range of motion and neck supple. No edema, erythema, rigidity, tenderness or crepitus. No pain with movement, spinous process tenderness or muscular tenderness. Normal range of motion.  Lymphadenopathy:     Cervical: No cervical adenopathy.  Skin:    General: Skin is warm and dry.     Coloration: Skin is not jaundiced.     Findings: No bruising or rash.  Neurological:     Mental Status: She is alert.     Motor: No abnormal muscle tone.     Coordination: Coordination normal.     Comments: MENTAL STATUS: AAOx3, memory intact, fund of knowledge appropriate  CRANIAL NERVES:   II: Pupils equal and reactive, no RAPD   III, IV, VI: EOM intact, no gaze preference or deviation, no nystagmus.   V: normal sensation in V1, V2, and V3 segments bilaterally   VII: no asymmetry, no nasolabial fold flattening   VIII: normal hearing to speech   IX, X: normal palatal elevation, no uvular deviation   XI: 5/5 head turn and 5/5 shoulder shrug bilaterally   XII: midline tongue protrusion  MOTOR:  5/5 bilateral grip strength 5/5 strength dorsiflexion/plantarflexion b/l  SENSORY:  Normal to light touch Romberg absent COORD: Normal finger to nose and heel to shin, no tremor, no dysmetria STATION: normal stance, no truncal ataxia GAIT: Normal; patient able to heel-walk.  Psychiatric:        Attention and Perception: She is inattentive.        Mood and Affect: Mood and affect normal.        Speech: Speech normal.        Behavior: Behavior is hyperactive. Behavior is cooperative.      Results for orders placed or performed in visit on 03/24/21  CBC with Differential/Platelet  Result Value Ref Range   WBC 5.0 3.8 - 10.8 Thousand/uL   RBC 4.05 3.80 - 5.10 Million/uL   Hemoglobin 13.7 11.7 - 15.5 g/dL   HCT 40.4 35.0 - 45.0 %   MCV 99.8 80.0 - 100.0 fL   MCH 33.8 (  H) 27.0 - 33.0 pg   MCHC  33.9 32.0 - 36.0 g/dL   RDW 12.9 11.0 - 15.0 %   Platelets 278 140 - 400 Thousand/uL   MPV 11.6 7.5 - 12.5 fL   Neutro Abs 2,315 1,500 - 7,800 cells/uL   Lymphs Abs 1,820 850 - 3,900 cells/uL   Absolute Monocytes 655 200 - 950 cells/uL   Eosinophils Absolute 140 15 - 500 cells/uL   Basophils Absolute 70 0 - 200 cells/uL   Neutrophils Relative % 46.3 %   Total Lymphocyte 36.4 %   Monocytes Relative 13.1 %   Eosinophils Relative 2.8 %   Basophils Relative 1.4 %  COMPLETE METABOLIC PANEL WITH GFR  Result Value Ref Range   Glucose, Bld 80 65 - 99 mg/dL   BUN 9 7 - 25 mg/dL   Creat 0.56 0.50 - 0.99 mg/dL   eGFR 113 > OR = 60 mL/min/1.27m   BUN/Creatinine Ratio NOT APPLICABLE 6 - 22 (calc)   Sodium 139 135 - 146 mmol/L   Potassium 4.8 3.5 - 5.3 mmol/L   Chloride 101 98 - 110 mmol/L   CO2 27 20 - 32 mmol/L   Calcium 9.7 8.6 - 10.2 mg/dL   Total Protein 7.0 6.1 - 8.1 g/dL   Albumin 4.5 3.6 - 5.1 g/dL   Globulin 2.5 1.9 - 3.7 g/dL (calc)   AG Ratio 1.8 1.0 - 2.5 (calc)   Total Bilirubin 0.4 0.2 - 1.2 mg/dL   Alkaline phosphatase (APISO) 56 31 - 125 U/L   AST 60 (H) 10 - 35 U/L   ALT 38 (H) 6 - 29 U/L  Lipid panel  Result Value Ref Range   Cholesterol 166 <200 mg/dL   HDL 91 > OR = 50 mg/dL   Triglycerides 49 <150 mg/dL   LDL Cholesterol (Calc) 61 mg/dL (calc)   Total CHOL/HDL Ratio 1.8 <5.0 (calc)   Non-HDL Cholesterol (Calc) 75 <130 mg/dL (calc)       Assessment & Plan:     ICD-10-CM   1. Closed head injury, initial encounter  S09.90XA nortriptyline (PAMELOR) 10 MG capsule   pt hit back of head on steel safe door after standing up quickly, injury 1-2 weeks ago, no LOC, no concern for TBI at this point    2. Chronic post-traumatic headache, not intractable  G44.329 nortriptyline (PAMELOR) 10 MG capsule   possibly triggered from neck, post traumatic vs concussion?  trial of nortriptyline dose at bedtime, avoid daily OTC meds, monitor HA severity with activity    3. GAD  (generalized anxiety disorder)  F41.1 busPIRone (BUSPAR) 10 MG tablet    LORazepam (ATIVAN) 0.5 MG tablet   pt hyperactive today, but pleasant, she stopped zoloft, but asked for buspar since that helped in the past, refills ordered    4. Persistent complex bereavement disorder  F43.81 busPIRone (BUSPAR) 10 MG tablet    LORazepam (ATIVAN) 0.5 MG tablet   her son's diagnosis, treatment/illness/ ICU and traumatic death anniversary and holidays approaching.  refill on ativan discussed    5. High risk medication use  Z79.899 LORazepam (ATIVAN) 0.5 MG tablet   reviewed ativan MOA, risks and side effects, pt and husband verbalize understanding of risks and how to use - rarely and for severe sx only      Pt is due for f/up visit - she states she will schedule CPE after the holidays    LDelsa Grana PA-C 11/14/21 4:23 PM

## 2021-11-15 ENCOUNTER — Encounter: Payer: Self-pay | Admitting: Family Medicine

## 2021-11-28 ENCOUNTER — Ambulatory Visit
Admission: RE | Admit: 2021-11-28 | Discharge: 2021-11-28 | Disposition: A | Discharge status: Home / Self Care | Payer: BC Managed Care – PPO | Source: Ambulatory Visit | Attending: Family Medicine | Admitting: Family Medicine

## 2021-11-28 DIAGNOSIS — Z1231 Encounter for screening mammogram for malignant neoplasm of breast: Secondary | ICD-10-CM | POA: Insufficient documentation

## 2022-03-28 ENCOUNTER — Encounter: Payer: BC Managed Care – PPO | Admitting: Family Medicine
